# Patient Record
Sex: Female | Born: 1995 | Race: Black or African American | Hispanic: No | Marital: Single | Smoking: Never smoker
Health system: Southern US, Community
[De-identification: ages and names within clinical notes are randomized; demographics above are authoritative.]

---
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
## 2016-10-04 ENCOUNTER — Emergency Department (HOSPITAL_COMMUNITY)
Admission: EM | Admit: 2016-10-04 | Discharge: 2016-10-04 | Disposition: A | Discharge status: Home / Self Care | Payer: Medicaid Other | Attending: Emergency Medicine | Admitting: Emergency Medicine

## 2016-10-04 ENCOUNTER — Emergency Department (HOSPITAL_COMMUNITY): Payer: Medicaid Other

## 2016-10-04 ENCOUNTER — Encounter (HOSPITAL_COMMUNITY): Payer: Self-pay | Admitting: Emergency Medicine

## 2016-10-04 DIAGNOSIS — M25511 Pain in right shoulder: Secondary | ICD-10-CM | POA: Diagnosis present

## 2016-10-04 DIAGNOSIS — M7521 Bicipital tendinitis, right shoulder: Secondary | ICD-10-CM | POA: Diagnosis not present

## 2016-10-04 MED ORDER — NAPROXEN 500 MG PO TABS: 500.0000 mg | ORAL_TABLET | Freq: Two times a day (BID) | ORAL | 0 refills | Status: DC

## 2016-10-04 MED ORDER — IBUPROFEN 400 MG PO TABS
600.0000 mg | ORAL_TABLET | Freq: Once | ORAL | Status: AC
  Administered 2016-10-04: 600 mg via ORAL
  Filled 2016-10-04: qty 1

## 2016-10-04 NOTE — Discharge Instructions (Signed)
Avoid any heavy lifting with right arm. Ice your shoulder several times a day. Naprosyn for pain and inflammation. Follow up with your doctor as needed.

## 2016-10-04 NOTE — ED Notes (Signed)
See edp assessment 

## 2016-10-04 NOTE — ED Notes (Signed)
Patient transported to X-ray 

## 2016-10-04 NOTE — ED Triage Notes (Signed)
Pt with R shoulder pain that started yesterday. CMS intact. Denies injury. No pain meds today.

## 2016-10-04 NOTE — ED Provider Notes (Signed)
[facility]-EMERGENCY DEPT Provider Note   CSN: [id number] Arrival date & time: 10/04/16  1637     History   Chief Complaint Chief Complaint  Patient presents with  . Shoulder Pain    R shoulder    HPI Shelly Peters is a 21 y.o. female.  HPI Shelly Peters is a 21 y.o. female presents to emergency department complaining of right shoulder injury. Patient states that she started feeling pain this morning after waking up. She reports playing with her newborn cousin who weighs approximately 8 pounds and states that she has been thinking a lot all day yesterday. She denies any other injuries. States pain is to the anterior shoulder, sharp. She reports pain is worsened with movement. Denies any numbness distal to the injury. Denies any weakness to the right hand. Denies any prior injuries to the same joint. No other complaints.  History reviewed. No pertinent past medical history.  There are no active problems to display for this patient.   History reviewed. No pertinent surgical history.  OB History    No data available       Home Medications    Prior to Admission medications   Medication Sig Start Date End Date Taking? Authorizing Provider  diphenhydrAMINE (BENADRYL) 25 MG tablet Take 25 mg by mouth every 6 (six) hours as needed for itching or allergies.    [provider]  EPINEPHrine 0.3 mg/0.3 mL IJ SOAJ injection Inject 0.3 mLs (0.3 mg total) into the muscle once. 09/13/15   Law, Waylan BogaAlexandra M, PA-C  etonogestrel (NEXPLANON) 68 MG IMPL implant 1 each by Subdermal route once.    [provider]  naproxen (NAPROSYN) 500 MG tablet Take 1 tablet (500 mg total) by mouth 2 (two) times daily. 10/04/16   Jaynie CrumbleKirichenko, Silvino Selman, PA-C    Family History No family history on file.  Social History Social History  Substance Use Topics  . Smoking status: Never Smoker  . Smokeless tobacco: Never Used  . Alcohol use No     Allergies   Apple and Cherry   Review of  Systems Review of Systems  Constitutional: Negative for chills and fever.  Respiratory: Negative for cough, chest tightness and shortness of breath.   Cardiovascular: Negative for chest pain, palpitations and leg swelling.  Musculoskeletal: Positive for arthralgias. Negative for myalgias, neck pain and neck stiffness.  Skin: Negative for rash.  Neurological: Negative for weakness and numbness.  All other systems reviewed and are negative.    Physical Exam Updated Vital Signs BP 109/63 (BP Location: Right Arm)   Pulse 89   Temp 98.6 F (37 C) (Oral)   Resp 18   SpO2 99%   Physical Exam  Constitutional: She appears well-developed and well-nourished. No distress.  HENT:  Head: Normocephalic.  Eyes: Conjunctivae are normal.  Neck: Neck supple.  Cardiovascular: Normal rate, regular rhythm and normal heart sounds.   Pulmonary/Chest: Effort normal and breath sounds normal. No respiratory distress. She has no wheezes. She has no rales.  Musculoskeletal: She exhibits no edema.  Normal-appearing right shoulder. Tender to palpation over right anterior shoulder at the biceps insertion point. Pain with flexion of the bicep. Biceps strength is intact. No bruising or swelling. Distal radial pulses intact and equal bilaterally.  Neurological: She is alert.  Skin: Skin is warm and dry.  Psychiatric: She has a normal mood and affect. Her behavior is normal.  Nursing note and vitals reviewed.    ED Treatments / Results  Labs (all  labs ordered are listed, but only abnormal results are displayed) Labs Reviewed - No data to display  EKG  EKG Interpretation None       Radiology Dg Shoulder Right  Result Date: 10/04/2016 CLINICAL DATA:  Right shoulder pain beginning yesterday. EXAM: RIGHT SHOULDER - 2+ VIEW COMPARISON:  None. FINDINGS: There is no evidence of fracture or dislocation. There is no evidence of arthropathy or other focal bone abnormality. Soft tissues are unremarkable.  IMPRESSION: Negative. Electronically Signed   By: Charlett NoseKevin  Dover M.D.   On: 10/04/2016 19:07    Procedures Procedures (including critical care time)  Medications Ordered in ED Medications  ibuprofen (ADVIL,MOTRIN) tablet 600 mg (600 mg Oral Given 10/04/16 1915)     Initial Impression / Assessment and Plan / ED Course  I have reviewed the triage vital signs and the nursing notes.  Pertinent labs & imaging results that were available during my care of the patient were reviewed by me and considered in my medical decision making (see chart for details).     Patient with shoulder pain, on exam, point tenderness is at the bicep tendon insertion in the anterior shoulder. Question tendinitis or a bicep tendon strain from playing with her child yesterday. Will treat with NSAIDs. Ice, rest, follow-up with family doctor as needed. Prescription for naproxen provided.  Vitals:   10/04/16 1647  BP: 109/63  Pulse: 89  Resp: 18  Temp: 98.6 F (37 C)  TempSrc: Oral  SpO2: 99%     Final Clinical Impressions(s) / ED Diagnoses   Final diagnoses:  Biceps tendinitis of right upper extremity    New Prescriptions New Prescriptions   NAPROXEN (NAPROSYN) 500 MG TABLET    Take 1 tablet (500 mg total) by mouth 2 (two) times daily.     Jaynie CrumbleKirichenko, Jaaziah Schulke, PA-C 10/04/16 1949    Arby BarrettePfeiffer, Marcy, MD 10/05/16 2342

## 2016-10-09 ENCOUNTER — Encounter (HOSPITAL_COMMUNITY): Payer: Self-pay | Admitting: Emergency Medicine

## 2016-10-09 ENCOUNTER — Emergency Department (HOSPITAL_COMMUNITY)
Admission: EM | Admit: 2016-10-09 | Discharge: 2016-10-09 | Disposition: A | Discharge status: Home / Self Care | Payer: Medicaid Other | Attending: Emergency Medicine | Admitting: Emergency Medicine

## 2016-10-09 DIAGNOSIS — X58XXXA Exposure to other specified factors, initial encounter: Secondary | ICD-10-CM | POA: Diagnosis not present

## 2016-10-09 DIAGNOSIS — Y9389 Activity, other specified: Secondary | ICD-10-CM | POA: Insufficient documentation

## 2016-10-09 DIAGNOSIS — S46911A Strain of unspecified muscle, fascia and tendon at shoulder and upper arm level, right arm, initial encounter: Secondary | ICD-10-CM | POA: Insufficient documentation

## 2016-10-09 DIAGNOSIS — S4991XA Unspecified injury of right shoulder and upper arm, initial encounter: Secondary | ICD-10-CM | POA: Diagnosis present

## 2016-10-09 DIAGNOSIS — Z79899 Other long term (current) drug therapy: Secondary | ICD-10-CM | POA: Insufficient documentation

## 2016-10-09 DIAGNOSIS — Y929 Unspecified place or not applicable: Secondary | ICD-10-CM | POA: Insufficient documentation

## 2016-10-09 DIAGNOSIS — Y999 Unspecified external cause status: Secondary | ICD-10-CM | POA: Insufficient documentation

## 2016-10-09 MED ORDER — MELOXICAM 15 MG PO TABS: 15.0000 mg | ORAL_TABLET | Freq: Every day | ORAL | 0 refills | Status: DC

## 2016-10-09 NOTE — ED Provider Notes (Signed)
[facility] Provider Note   CSN: [id number] Arrival date & time: 10/09/16  2102     History   Chief Complaint Chief Complaint  Patient presents with  . Shoulder Pain    HPI Shelly Peters is a 21 y.o. female.  The history is provided by the patient. No language interpreter was used.  Shoulder Pain      Shelly Peters is a 21 y.o. female who presents to the Emergency Department complaining of right shoulder pain.  A week ago she developed pain in her right anterior shoulder. She denies any injuries to this arm. She notes pain with abduction of the shoulder. She was seen in the emergency department 5 days ago and was started on naproxen and only reports mild improvement in her symptoms with this medication. She denies any fevers, chest pain, shortness breath, abdominal pain, nausea, vomiting, no numbness, weakness.  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 She has been having difficulty with typing due to right shoulder pain.  History reviewed. No pertinent past medical history.  There are no active problems to display for this patient.   History reviewed. No pertinent surgical history.  OB History    No data available       Home Medications    Prior to Admission medications   Medication Sig Start Date End Date Taking? Authorizing Provider  diphenhydrAMINE (BENADRYL) 25 MG tablet Take 25 mg by mouth every 6 (six) hours as needed for itching or allergies.    [provider]  EPINEPHrine 0.3 mg/0.3 mL IJ SOAJ injection Inject 0.3 mLs (0.3 mg total) into the muscle once. 09/13/15   Law, Waylan BogaAlexandra M, PA-C  etonogestrel (NEXPLANON) 68 MG IMPL implant 1 each by Subdermal route once.    [provider]  naproxen (NAPROSYN) 500 MG tablet Take 1 tablet (500 mg total) by mouth 2 (two) times daily. 10/04/16   Jaynie CrumbleKirichenko, Tatyana, PA-C    Family History No family history on file.  Social History Social History    Substance Use Topics  . Smoking status: Never Smoker  . Smokeless tobacco: Never Used  . Alcohol use No     Allergies   Apple and Cherry   Review of Systems Review of Systems  All other systems reviewed and are negative.    Physical Exam Updated Vital Signs BP 120/65 (BP Location: Left Arm)   Pulse 84   Temp 98.5 F (36.9 C) (Oral)   Resp 16   Ht 5\' 6"  (1.676 m)   Wt 81.6 kg (180 lb)   SpO2 100%   BMI 29.05 kg/m   Physical Exam  Constitutional: She is oriented to person, place, and time. She appears well-developed and well-nourished.  HENT:  Head: Normocephalic and atraumatic.  Cardiovascular: Normal rate and regular rhythm.   No murmur heard. Pulmonary/Chest: Effort normal and breath sounds normal. No respiratory distress.  Abdominal: Soft. There is no tenderness. There is no rebound and no guarding.  Musculoskeletal:  2+ radial pulses bilaterally. There is point tenderness to palpation over the right AC joint. There is pain with abduction of the shoulder and lateral rotation of the right upper extremity. 5 out of 5 grip strength in bilateral upper extremities.  Neurological: She is alert and oriented to person, place, and time.  Skin: Skin is warm and dry.  Psychiatric: She has a normal mood and affect. Her behavior is normal.  Nursing note and vitals reviewed.  ED Treatments / Results  Labs (all labs ordered are listed, but only abnormal results are displayed) Labs Reviewed - No data to display  EKG  EKG Interpretation None       Radiology No results found.  Procedures Procedures (including critical care time)  Medications Ordered in ED Medications - No data to display   Initial Impression / Assessment and Plan / ED Course  I have reviewed the triage vital signs and the nursing notes.  Pertinent labs & imaging results that were available during my care of the patient were reviewed by me and considered in my medical decision making (see  chart for details).     Patient here for evaluation of right shoulder pain for the last week. Examination is consistent with musculoskeletal pain. There is no evidence of acute infection, dislocation, nerve impingement. Presentation is not consistent with referred cardiac, pulmonary or abdominal pain. Discussed with patient home care for shoulder pain with range of motion exercises. Will change naproxen.  Discussed ice, adding tylenol if needed.    Counseled patient on home care, outpatient follow up and return precautions.  Providing sling for comfort with activities but discussed importance of range of motion exercises.  Final Clinical Impressions(s) / ED Diagnoses   Final diagnoses:  None    New Prescriptions New Prescriptions   No medications on file     Tilden Fossa, MD 10/09/16 2151

## 2016-10-09 NOTE — ED Triage Notes (Signed)
Pt seen last week for R shoulder pain, dx with bicep tendinitis. Pt reports pain has worsened. Has not been able to follow up.

## 2016-10-09 NOTE — ED Notes (Signed)
Patient given discharge instructions and verbalized understanding.  Patient stable to discharge at this time.  Patient is alert and oriented to baseline.  No distressed noted at this time.  All belongings taken with the patient at discharge.   

## 2016-10-12 ENCOUNTER — Encounter (HOSPITAL_COMMUNITY): Payer: Self-pay | Admitting: Emergency Medicine

## 2016-10-12 ENCOUNTER — Emergency Department (HOSPITAL_COMMUNITY)
Admission: EM | Admit: 2016-10-12 | Discharge: 2016-10-12 | Disposition: A | Discharge status: Home / Self Care | Payer: Medicaid Other | Attending: Emergency Medicine | Admitting: Emergency Medicine

## 2016-10-12 DIAGNOSIS — M25562 Pain in left knee: Secondary | ICD-10-CM | POA: Diagnosis present

## 2016-10-12 DIAGNOSIS — M25552 Pain in left hip: Secondary | ICD-10-CM | POA: Insufficient documentation

## 2016-10-12 DIAGNOSIS — R1032 Left lower quadrant pain: Secondary | ICD-10-CM | POA: Diagnosis not present

## 2016-10-12 NOTE — ED Triage Notes (Addendum)
Pt states she woke up this morning with atraumatic  R hip and R knee pain. States it hurts to bend the knee. Alert and oriented.

## 2016-10-12 NOTE — ED Provider Notes (Signed)
[facility]-EMERGENCY DEPT Provider Note   CSN: [id number] Arrival date & time: 10/12/16  0857     History   Chief Complaint Chief Complaint  Patient presents with  . Leg Pain    HPI Shelly Peters is a 21 y.o. female with no significant past history he presents to the ED today complaining of left groin and left anterior knee pain. He states that she woke this morning and began feeling this pain out of nowhere. Pain is worse with walking, with applying pressure to her knee and with abduction of her left hip. She denies any pelvic pain, dysuria, vaginal discharge, calf pain, swelling. She is on birth control, the Nexplanon. She denies any trauma or injury. She reports that she's been having various joint aches and pains of the last 2 weeks. She states that yesterday her right ankle was hurting, last week her right shoulder was hurting, Both of which have since resolved. She has tried taking naproxen for symptoms of relief. She was recently prescribed Robaxin which she has yet to fill this prescription. No recent heavy lifting.   HPI  History reviewed. No pertinent past medical history.  There are no active problems to display for this patient.   History reviewed. No pertinent surgical history.  OB History    No data available       Home Medications    Prior to Admission medications   Medication Sig Start Date End Date Taking? Authorizing Provider  diphenhydrAMINE (BENADRYL) 25 MG tablet Take 25 mg by mouth every 6 (six) hours as needed for itching or allergies.    [provider]  EPINEPHrine 0.3 mg/0.3 mL IJ SOAJ injection Inject 0.3 mLs (0.3 mg total) into the muscle once. 09/13/15   Law, Waylan BogaAlexandra M, PA-C  etonogestrel (NEXPLANON) 68 MG IMPL implant 1 each by Subdermal route once.    [provider]  meloxicam (MOBIC) 15 MG tablet Take 1 tablet (15 mg total) by mouth daily. 10/09/16   Tilden Fossaees, Elizabeth, MD  naproxen (NAPROSYN) 500 MG tablet Take 1 tablet (500 mg total)  by mouth 2 (two) times daily. 10/04/16   Jaynie CrumbleKirichenko, Tatyana, PA-C    Family History History reviewed. No pertinent family history.  Social History Social History  Substance Use Topics  . Smoking status: Never Smoker  . Smokeless tobacco: Never Used  . Alcohol use No     Allergies   Apple and Cherry   Review of Systems Review of Systems  All other systems reviewed and are negative.    Physical Exam Updated Vital Signs BP 136/68   Pulse 78   Temp 98.4 F (36.9 C)   Resp 16   LMP 10/11/2016 (Approximate)   SpO2 100%   Physical Exam  Constitutional: She is oriented to person, place, and time. She appears well-developed and well-nourished. No distress.  HENT:  Head: Normocephalic and atraumatic.  Eyes: Conjunctivae are normal. Right eye exhibits no discharge. Left eye exhibits no discharge. No scleral icterus.  Cardiovascular: Normal rate.   Pulmonary/Chest: Effort normal.  Musculoskeletal:  TTP along her left inguinal canal without any obvious bulge. No palpable hernia. Pain increased with abduction of left hip. She also has TTP along her anterior knee without any obvious bony deformity. Pain is increased I in her groin with flexion and extension of her left knee.Negative anterior/poster drawer bilaterally. Negative ballottement test. No varus or valgus laxity. No crepitus.    Neurological: She is alert and oriented to person, place, and time. Coordination  normal.  No gait abnormality  Skin: Skin is warm and dry. No rash noted. She is not diaphoretic. No erythema. No pallor.  Psychiatric: She has a normal mood and affect. Her behavior is normal.  Nursing note and vitals reviewed.    ED Treatments / Results  Labs (all labs ordered are listed, but only abnormal results are displayed) Labs Reviewed - No data to display  EKG  EKG Interpretation None       Radiology No results found.  Procedures Procedures (including critical care time)  Medications  Ordered in ED Medications - No data to display   Initial Impression / Assessment and Plan / ED Course  I have reviewed the triage vital signs and the nursing notes.  Pertinent labs & imaging results that were available during my care of the patient were reviewed by me and considered in my medical decision making (see chart for details).    Otherwise healthy 21 year old female presents to the ED with 1 day history of left groin and left knee pain. States she woke up out of bed this morning and had this pain. Pain is worsened with pressure, palpation and abduction of left hip. My exam I do not see anything concerning for inguinal hernia. She is low risk loss criteria. Do not suspect DVT. She has no Swelling, chest pain or shortness of breath. I suspect she has a muscle strain. She was recently prescribed Robaxin which I encouraged her to take at home. Apply ice affected area. She recently obtained Medicaid and is supposed to follow-up with primary care doctor. She is ambulating without any difficulty.  Return precautions outlined in patient discharge instructions.    Final Clinical Impressions(s) / ED Diagnoses   Final diagnoses:  Acute pain of left knee  Left inguinal pain    New Prescriptions Discharge Medication List as of 10/12/2016 11:10 AM       Eriyana Sweeten, Lester Kinsman, PA-C 10/12/16 1654    Lavera Guise, MD 10/12/16 1810

## 2016-10-12 NOTE — Discharge Instructions (Signed)
Take home Naprosyn and Robaxin for muscle and joint aches. Try to exercise as much as possible. Follow up with a primary care provider for ongoing evaluation. Itching to the ED case. Severe worsening symptoms, calf pain or swelling, chest pain, shortness of breath, weakness or numbness in any extremity.

## 2016-11-10 ENCOUNTER — Encounter (HOSPITAL_COMMUNITY): Payer: Self-pay

## 2016-11-10 ENCOUNTER — Emergency Department (HOSPITAL_COMMUNITY)
Admission: EM | Admit: 2016-11-10 | Discharge: 2016-11-10 | Disposition: A | Discharge status: Home / Self Care | Payer: Self-pay | Attending: Emergency Medicine | Admitting: Emergency Medicine

## 2016-11-10 DIAGNOSIS — B9789 Other viral agents as the cause of diseases classified elsewhere: Secondary | ICD-10-CM | POA: Insufficient documentation

## 2016-11-10 DIAGNOSIS — Z79899 Other long term (current) drug therapy: Secondary | ICD-10-CM | POA: Insufficient documentation

## 2016-11-10 DIAGNOSIS — J029 Acute pharyngitis, unspecified: Secondary | ICD-10-CM | POA: Insufficient documentation

## 2016-11-10 LAB — RAPID STREP SCREEN (MED CTR MEBANE ONLY): STREPTOCOCCUS, GROUP A SCREEN (DIRECT): NEGATIVE

## 2016-11-10 NOTE — Discharge Instructions (Signed)
Your strep test is negative. Please continue ibuprofen and tylenol for pain. Return for worsening symptoms, including fever, difficulty breathing, unable to swallow saliva, neck swelling or any other symptoms concerning to you.

## 2016-11-10 NOTE — ED Provider Notes (Signed)
[facility] DEPT Provider Note   CSN: [id number] Arrival date & time: 11/10/16  3086     History   Chief Complaint Chief Complaint  Patient presents with  . Sore Throat    HPI Shelly Peters is a 21 y.o. female.  The history is provided by the patient.  Sore Throat  This is a new problem. The current episode started yesterday. The problem occurs constantly. The problem has not changed since onset.Pertinent negatives include no chest pain, no abdominal pain, no headaches and no shortness of breath. The symptoms are aggravated by swallowing. Nothing relieves the symptoms. She has tried nothing for the symptoms.   21 year old female, otherwise healthy, who presents with sore throat. Reports that her voice has been hoarse over the past 2 weeks and yesterday developed a sore throat, worse with swallowing. No fevers, cough, congestion, runny nose. He did state that she has recent exposure to somebody diagnosed with strep. No throat swelling, difficulty breathing, or muffled voice. History reviewed. No pertinent past medical history.  There are no active problems to display for this patient.   History reviewed. No pertinent surgical history.  OB History    No data available       Home Medications    Prior to Admission medications   Medication Sig Start Date End Date Taking? Authorizing Provider  etonogestrel (NEXPLANON) 68 MG IMPL implant 1 each by Subdermal route once.   Yes [provider]  ibuprofen (ADVIL,MOTRIN) 800 MG tablet Take 800 mg by mouth every 8 (eight) hours as needed for headache or moderate pain.   Yes [provider]  naproxen (NAPROSYN) 500 MG tablet Take 1 tablet (500 mg total) by mouth 2 (two) times daily. 10/04/16  Yes Kirichenko, Tatyana, PA-C  EPINEPHrine 0.3 mg/0.3 mL IJ SOAJ injection Inject 0.3 mLs (0.3 mg total) into the muscle once. Patient not taking: Reported on 11/10/2016 09/13/15   Emi Holes, PA-C  meloxicam (MOBIC) 15 MG  tablet Take 1 tablet (15 mg total) by mouth daily. Patient not taking: Reported on 11/10/2016 10/09/16   Tilden Fossa, MD    Family History History reviewed. No pertinent family history.  Social History Social History  Substance Use Topics  . Smoking status: Never Smoker  . Smokeless tobacco: Never Used  . Alcohol use No     Allergies   Apple and Cherry   Review of Systems Review of Systems  Constitutional: Negative for fever.  HENT: Positive for sore throat.   Respiratory: Negative for shortness of breath.   Cardiovascular: Negative for chest pain.  Gastrointestinal: Negative for abdominal pain.  Allergic/Immunologic: Negative for immunocompromised state.  Neurological: Negative for headaches.  Hematological: Does not bruise/bleed easily.     Physical Exam Updated Vital Signs BP 116/69 (BP Location: Left Arm)   Pulse 69   Temp 98.4 F (36.9 C) (Oral)   Resp 16   Ht  (1.676 m)   Wt 81.6 kg (180 lb)   LMP 10/11/2016 (Approximate)   SpO2 99%   BMI 29.05 kg/m   Physical Exam Physical Exam  Nursing note and vitals reviewed. Constitutional: Well developed, well nourished, non-toxic, and in no acute distress Head: Normocephalic and atraumatic.  Mouth/Throat: Oropharynx is clear and moist. No swelling or exudates Neck: Normal range of motion. Neck supple. No cervical lymphadenopathy.  Cardiovascular: Normal rate and regular rhythm.   Pulmonary/Chest: Effort normal and breath sounds normal.  Abdominal: Soft. There is no tenderness. There is no rebound and  no guarding.  Musculoskeletal: Normal range of motion.  Neurological: Alert, no facial droop, fluent speech, moves all extremities symmetrically Skin: Skin is warm and dry.  Psychiatric: Cooperative   ED Treatments / Results  Labs (all labs ordered are listed, but only abnormal results are displayed) Labs Reviewed  RAPID STREP SCREEN (NOT AT [facility])  CULTURE, GROUP A STREP [facility]    EKG  EKG  Interpretation None       Radiology No results found.  Procedures Procedures (including critical care time)  Medications Ordered in ED Medications - No data to display   Initial Impression / Assessment and Plan / ED Course  I have reviewed the triage vital signs and the nursing notes.  Pertinent labs & imaging results that were available during my care of the patient were reviewed by me and considered in my medical decision making (see chart for details).     Well appearing, in no acute distress. Normal vital signs. Breathing comfortably.Normal range of motion of the neck, no swelling, no trismus, no uvular deviation. No concerns for deep space soft tissue neck infection. Strep negative. Likely viral etiology. Discussed continued supportive care management for this.  Final Clinical Impressions(s) / ED Diagnoses   Final diagnoses:  Viral pharyngitis  Sore throat    New Prescriptions New Prescriptions   No medications on file     Lavera GuiseLiu, Meeya Goldin Duo, MD 11/10/16 1104

## 2016-11-10 NOTE — ED Triage Notes (Signed)
Patient c/o sore throat x 2 weeks and has gotten  Progressively worse.

## 2016-11-12 LAB — CULTURE, GROUP A STREP (THRC)

## 2017-05-21 DIAGNOSIS — R21 Rash and other nonspecific skin eruption: Secondary | ICD-10-CM | POA: Insufficient documentation

## 2017-05-21 DIAGNOSIS — Z5321 Procedure and treatment not carried out due to patient leaving prior to being seen by health care provider: Secondary | ICD-10-CM | POA: Insufficient documentation

## 2017-05-22 ENCOUNTER — Encounter (HOSPITAL_COMMUNITY): Payer: Self-pay | Admitting: Emergency Medicine

## 2017-05-22 ENCOUNTER — Emergency Department (HOSPITAL_COMMUNITY)
Admission: EM | Admit: 2017-05-22 | Discharge: 2017-05-22 | Discharge status: Against Medical Advice | Payer: Self-pay | Attending: Emergency Medicine | Admitting: Emergency Medicine

## 2017-05-22 MED ORDER — DIPHENHYDRAMINE HCL 50 MG/ML IJ SOLN
25.0000 mg | Freq: Once | INTRAMUSCULAR | Status: DC
  Filled 2017-05-22: qty 1

## 2017-05-22 MED ORDER — FAMOTIDINE 20 MG PO TABS
40.0000 mg | ORAL_TABLET | Freq: Once | ORAL | Status: DC
  Filled 2017-05-22: qty 2

## 2017-05-22 MED ORDER — PREDNISONE 20 MG PO TABS
60.0000 mg | ORAL_TABLET | Freq: Once | ORAL | Status: DC
  Filled 2017-05-22: qty 3

## 2017-05-22 NOTE — ED Triage Notes (Signed)
Patient here from home with complaints of allergic reaction. Rash to face, itching. Denies throat swelling. Denies taking benadryl.

## 2017-05-22 NOTE — ED Notes (Signed)
Per Gabriel Earingaquita, triage nurse, nurse witnessed patient leaving building. EDPA made aware.

## 2017-05-22 NOTE — ED Notes (Signed)
Upon arrival to room, patient is not in the room. Bathrooms also checked. EDPA made aware and will wait to see if patient can be found.

## 2017-09-17 ENCOUNTER — Encounter (HOSPITAL_COMMUNITY): Payer: Self-pay | Admitting: Emergency Medicine

## 2017-09-17 ENCOUNTER — Other Ambulatory Visit: Payer: Self-pay

## 2017-09-17 ENCOUNTER — Ambulatory Visit (HOSPITAL_COMMUNITY)
Admission: EM | Admit: 2017-09-17 | Discharge: 2017-09-17 | Disposition: A | Discharge status: Home / Self Care | Payer: Self-pay | Attending: Family Medicine | Admitting: Family Medicine

## 2017-09-17 DIAGNOSIS — J029 Acute pharyngitis, unspecified: Secondary | ICD-10-CM | POA: Insufficient documentation

## 2017-09-17 DIAGNOSIS — Z79899 Other long term (current) drug therapy: Secondary | ICD-10-CM | POA: Insufficient documentation

## 2017-09-17 LAB — POCT RAPID STREP A: STREPTOCOCCUS, GROUP A SCREEN (DIRECT): NEGATIVE

## 2017-09-17 MED ORDER — AMOXICILLIN 500 MG PO CAPS: 500.0000 mg | ORAL_CAPSULE | Freq: Two times a day (BID) | ORAL | 0 refills | Status: AC

## 2017-09-17 NOTE — ED Provider Notes (Signed)
[facility]    CSN: [id number] Arrival date & time: 09/17/17  1436     History   Chief Complaint Chief Complaint  Patient presents with  . Sore Throat    HPI Shelly Peters is a 22 y.o. female.   Shelly Peters presents with complaints of sore throat and headache for the past three days. Some chills, no specific known fever. Nasal congestion. Occasional cough. No gi/gu complaints. No rash. No known ill contacts. Denies  Any previous similar. No ear pain. Has taken benadryl and alkaselzer which have not helped with symptoms. Without contributing medical history.     ROS per HPI.      History reviewed. No pertinent past medical history.  There are no active problems to display for this patient.   History reviewed. No pertinent surgical history.  OB History   None      Home Medications    Prior to Admission medications   Medication Sig Start Date End Date Taking? Authorizing Provider  etonogestrel (NEXPLANON) 68 MG IMPL implant 1 each by Subdermal route once.   Yes [provider]  amoxicillin (AMOXIL) 500 MG capsule Take 1 capsule (500 mg total) by mouth 2 (two) times daily for 10 days. 09/21/17 10/01/17  Linus Mako B, NP  EPINEPHrine 0.3 mg/0.3 mL IJ SOAJ injection Inject 0.3 mLs (0.3 mg total) into the muscle once. Patient not taking: Reported on 11/10/2016 09/13/15   Emi Holes, PA-C  ibuprofen (ADVIL,MOTRIN) 800 MG tablet Take 800 mg by mouth every 8 (eight) hours as needed for headache or moderate pain.    [provider]  meloxicam (MOBIC) 15 MG tablet Take 1 tablet (15 mg total) by mouth daily. Patient not taking: Reported on 11/10/2016 10/09/16   Tilden Fossa, MD  naproxen (NAPROSYN) 500 MG tablet Take 1 tablet (500 mg total) by mouth 2 (two) times daily. 10/04/16   Jaynie Crumble, PA-C    Family History History reviewed. No pertinent family history.  Social History Social History   Tobacco Use  . Smoking status: Never  Smoker  . Smokeless tobacco: Never Used  Substance Use Topics  . Alcohol use: No  . Drug use: No     Allergies   Apple and Cherry   Review of Systems Review of Systems   Physical Exam Triage Vital Signs ED Triage Vitals  Enc Vitals Group     BP 09/17/17 1526 108/67     Pulse Rate 09/17/17 1526 98     Resp --      Temp 09/17/17 1526 98.7 F (37.1 C)     Temp Source 09/17/17 1526 Oral     SpO2 09/17/17 1526 100 %     Weight --      Height --      Head Circumference --      Peak Flow --      Pain Score 09/17/17 1525 8     Pain Loc --      Pain Edu? --      Excl. in [place]? --    No data found.  Updated Vital Signs BP 108/67 (BP Location: Right Arm)   Pulse 98   Temp 98.7 F (37.1 C) (Oral)   SpO2 100%    Physical Exam  Constitutional: She is oriented to person, place, and time. She appears well-developed and well-nourished. No distress.  HENT:  Head: Normocephalic and atraumatic.  Right Ear: Tympanic membrane, external ear and ear canal normal.  Left Ear:  Tympanic membrane, external ear and ear canal normal.  Nose: Nose normal.  Mouth/Throat: Uvula is midline and mucous membranes are normal. Posterior oropharyngeal erythema (petechiae noted ) present. Tonsils are 1+ on the right. Tonsils are 1+ on the left. Tonsillar exudate.  Eyes: Pupils are equal, round, and reactive to light. Conjunctivae and EOM are normal.  Cardiovascular: Normal rate, regular rhythm and normal heart sounds.  Pulmonary/Chest: Effort normal and breath sounds normal.  Lymphadenopathy:    She has no cervical adenopathy.  Neurological: She is alert and oriented to person, place, and time.  Skin: Skin is warm and dry.     UC Treatments / Results  Labs (all labs ordered are listed, but only abnormal results are displayed) Labs Reviewed  CULTURE, GROUP A STREP [facility]  POCT RAPID STREP A    EKG None  Radiology No results found.  Procedures Procedures (including critical care  time)  Medications Ordered in UC Medications - No data to display  Initial Impression / Assessment and Plan / UC Course  I have reviewed the triage vital signs and the nursing notes.  Pertinent labs & imaging results that were available during my care of the patient were reviewed by me and considered in my medical decision making (see chart for details).    Afebrile. Negative rapid strep. History and physical consistent with viral illness.  Culture pending. Amoxicillin provided if no improvement or if worsening in another 5 days. Supportive cares recommended. If symptoms worsen or do not improve in the next week to return to be seen or to follow up with PCP.  Patient verbalized understanding and agreeable to plan.    Final Clinical Impressions(s) / UC Diagnoses   Final diagnoses:  Pharyngitis, unspecified etiology     Discharge Instructions     Throat lozenges, gargles, chloraseptic spray, warm teas, popsicles etc to help with throat pain.  Tylenol and/or ibuprofen as needed for pain or fevers.   Will notify you if your culture returns positive.  If no improvement or if worsening of symptoms by Friday may pick up and take course of antibiotics. If improving no need to take.  If symptoms worsen or do not improve in the next week to return to be seen or to follow up with your PCP.      ED Prescriptions    Medication Sig Dispense Auth. Provider   amoxicillin (AMOXIL) 500 MG capsule Take 1 capsule (500 mg total) by mouth 2 (two) times daily for 10 days. 20 capsule Georgetta HaberBurky, Natalie B, NP     Controlled Substance Prescriptions [place] Controlled Substance Registry consulted? Not Applicable   Georgetta HaberBurky, Natalie B, NP 09/17/17 1652

## 2017-09-17 NOTE — ED Triage Notes (Signed)
Pt reports nasal congestion and sore throat x3 days.  Pt also reports chills.

## 2017-09-17 NOTE — Discharge Instructions (Addendum)
Throat lozenges, gargles, chloraseptic spray, warm teas, popsicles etc to help with throat pain.  Tylenol and/or ibuprofen as needed for pain or fevers.   Will notify you if your culture returns positive.  If no improvement or if worsening of symptoms by Friday may pick up and take course of antibiotics. If improving no need to take.  If symptoms worsen or do not improve in the next week to return to be seen or to follow up with your PCP.

## 2017-09-19 LAB — CULTURE, GROUP A STREP (THRC)

## 2017-09-20 ENCOUNTER — Telehealth (HOSPITAL_COMMUNITY): Payer: Self-pay

## 2017-09-20 NOTE — Telephone Encounter (Signed)
Strep test is positive. [facility] Attempted to reach patient. She is not available at this time.

## 2017-12-08 ENCOUNTER — Encounter (HOSPITAL_COMMUNITY): Payer: Self-pay | Admitting: Emergency Medicine

## 2017-12-08 ENCOUNTER — Ambulatory Visit (HOSPITAL_COMMUNITY)
Admission: EM | Admit: 2017-12-08 | Discharge: 2017-12-08 | Disposition: A | Discharge status: Home / Self Care | Payer: Self-pay | Attending: Emergency Medicine | Admitting: Emergency Medicine

## 2017-12-08 DIAGNOSIS — T7840XA Allergy, unspecified, initial encounter: Secondary | ICD-10-CM

## 2017-12-08 MED ORDER — DIPHENHYDRAMINE HCL 25 MG PO CAPS
ORAL_CAPSULE | ORAL | Status: AC
  Filled 2017-12-08: qty 2

## 2017-12-08 MED ORDER — PREDNISONE 20 MG PO TABS
ORAL_TABLET | ORAL | Status: AC
  Filled 2017-12-08: qty 2

## 2017-12-08 MED ORDER — PREDNISONE 20 MG PO TABS
40.0000 mg | ORAL_TABLET | Freq: Once | ORAL | Status: AC
  Administered 2017-12-08: 40 mg via ORAL

## 2017-12-08 MED ORDER — EPINEPHRINE 0.3 MG/0.3ML IJ SOAJ: 0.3000 mg | Freq: Once | INTRAMUSCULAR | 3 refills | Status: AC

## 2017-12-08 MED ORDER — PREDNISONE 20 MG PO TABS: 40.0000 mg | ORAL_TABLET | Freq: Every day | ORAL | 0 refills | Status: DC

## 2017-12-08 MED ORDER — DIPHENHYDRAMINE HCL 25 MG PO CAPS
50.0000 mg | ORAL_CAPSULE | Freq: Once | ORAL | Status: AC
  Administered 2017-12-08: 50 mg via ORAL

## 2017-12-08 NOTE — ED Provider Notes (Signed)
[facility]    CSN: [id number] Arrival date & time: 12/08/17  1245     History   Chief Complaint Chief Complaint  Patient presents with  . Urticaria    HPI Shelly Peters is a 22 y.o. female.   HPI   She is presenting today with complaints of hives from "head to toe".  Patient states she is "very itchy".  Patient denies swelling of lips, tongue, mouth, throat, or face.  Denies shortness of breath, wheezing, tingling or numbness of lips tongue or face.  The patient states she does feel like her right ear is starting to swell.  Patient states she is unsure of what she might potentially be allergic to other than apples and cherries.  Patient states she has not eaten any foods that she does not normally eat.  Patient states she did spend the night at someone's home yesterday on their bedding as potential allergen.    History reviewed. No pertinent past medical history.  There are no active problems to display for this patient.   History reviewed. No pertinent surgical history.  OB History   None      Home Medications    Prior to Admission medications   Medication Sig Start Date End Date Taking? Authorizing Provider  EPINEPHrine 0.3 mg/0.3 mL IJ SOAJ injection Inject 0.3 mLs (0.3 mg total) into the muscle once for 1 dose. 12/08/17 12/08/17  Servando Salina, NP  etonogestrel (NEXPLANON) 68 MG IMPL implant 1 each by Subdermal route once.    [provider]  ibuprofen (ADVIL,MOTRIN) 800 MG tablet Take 800 mg by mouth every 8 (eight) hours as needed for headache or moderate pain.    [provider]  meloxicam (MOBIC) 15 MG tablet Take 1 tablet (15 mg total) by mouth daily. Patient not taking: Reported on 11/10/2016 10/09/16   Tilden Fossa, MD  naproxen (NAPROSYN) 500 MG tablet Take 1 tablet (500 mg total) by mouth 2 (two) times daily. 10/04/16   Kirichenko, Lemont Fillers, PA-C  predniSONE (DELTASONE) 20 MG tablet Take 2 tablets (40 mg total) by mouth daily.  Starting on 12/09/2017 take two tablets (40mg ) daily until gone. 12/08/17   Servando Salina, NP    Family History History reviewed. No pertinent family history.  Social History Social History   Tobacco Use  . Smoking status: Never Smoker  . Smokeless tobacco: Never Used  Substance Use Topics  . Alcohol use: No  . Drug use: No     Allergies   Apple and Cherry   Review of Systems Review of Systems  Constitutional: Negative.  Negative for chills and fever.  HENT: Negative.  Negative for drooling, ear pain, facial swelling, sinus pressure, sneezing, sore throat, trouble swallowing and voice change.   Eyes: Negative.  Negative for itching.  Respiratory: Negative for cough, chest tightness, shortness of breath and wheezing.   Cardiovascular: Negative.   Gastrointestinal: Negative.   Endocrine: Negative.   Genitourinary: Negative.   Musculoskeletal: Negative.  Negative for neck pain and neck stiffness.  Skin: Positive for rash. Negative for pallor.  Allergic/Immunologic: Positive for environmental allergies and food allergies. Negative for immunocompromised state.  Neurological: Negative.  Negative for speech difficulty and weakness.  Hematological: Negative.   Psychiatric/Behavioral: Negative.      Physical Exam Triage Vital Signs ED Triage Vitals  Enc Vitals Group     BP      Pulse      Resp      Temp  Temp src      SpO2      Weight      Height      Head Circumference      Peak Flow      Pain Score      Pain Loc      Pain Edu?      Excl. in GC?    No data found.  Updated Vital Signs BP 122/61 (BP Location: Left Arm)   Pulse 71   Temp 97.8 F (36.6 C) (Oral)   Resp 18   SpO2 97%   Visual Acuity Right Eye Distance:   Left Eye Distance:   Bilateral Distance:    Right Eye Near:   Left Eye Near:    Bilateral Near:     Physical Exam  Constitutional: She is oriented to person, place, and time. She appears well-developed and well-nourished. No  distress.  HENT:  Head: Normocephalic and atraumatic.  Right Ear: External ear normal.  Left Ear: External ear normal.  Nose: Nose normal.  Mouth/Throat: Oropharynx is clear and moist. No oropharyngeal exudate.  Swelling noted of lips or tongue.  Eyes: Pupils are equal, round, and reactive to light. EOM are normal. Right eye exhibits no discharge. Left eye exhibits no discharge. No scleral icterus.  Neck: Normal range of motion. Neck supple. No tracheal deviation present.  Cardiovascular: Normal rate, regular rhythm, normal heart sounds and intact distal pulses. Exam reveals no gallop and no friction rub.  No murmur heard. Pulmonary/Chest: Effort normal and breath sounds normal. No stridor. No respiratory distress. She has no wheezes. She has no rales. She exhibits no tenderness.  No evidence of respiratory distress.  No adventitious breath sounds noted.  Patient is able to speak in full and complete sentences.  Musculoskeletal: Normal range of motion.  Lymphadenopathy:    She has no cervical adenopathy.  Neurological: She is alert and oriented to person, place, and time.  Skin: Skin is warm and dry. Capillary refill takes less than 2 seconds. Rash noted. Rash is urticarial. She is not diaphoretic. No erythema. No pallor.  She has hives from head to toe.  They are larger and more prominent across bilateral legs face and lower arms.  Nursing note and vitals reviewed.    UC Treatments / Results  Labs (all labs ordered are listed, but only abnormal results are displayed) Labs Reviewed - No data to display  EKG None  Radiology No results found.  Procedures Procedures (including critical care time)  Medications Ordered in UC Medications  predniSONE (DELTASONE) tablet 40 mg (40 mg Oral Given 12/08/17 1338)  diphenhydrAMINE (BENADRYL) capsule 50 mg (50 mg Oral Given 12/08/17 1338)    Initial Impression / Assessment and Plan / UC Course  I have reviewed the triage vital signs and  the nursing notes.  Pertinent labs & imaging results that were available during my care of the patient were reviewed by me and considered in my medical decision making (see chart for details).     Discussed with patient the importance carrying an EpiPen and to follow-up with an allergist to identify potential allergens.  Agree with patient that it is likely that sleeping on her friends bedding may have caused a reaction secondary to soap or detergents.  Final Clinical Impressions(s) / UC Diagnoses   Final diagnoses:  Acute allergic reaction, initial encounter     Discharge Instructions     Follow up with an allergist for further evaluation and identification of allergens.  ED Prescriptions    Medication Sig Dispense Auth. Provider   EPINEPHrine 0.3 mg/0.3 mL IJ SOAJ injection Inject 0.3 mLs (0.3 mg total) into the muscle once for 1 dose. 1 Device Weber Cooks H, NP   predniSONE (DELTASONE) 20 MG tablet Take 2 tablets (40 mg total) by mouth daily. Starting on 12/09/2017 take two tablets (40mg ) daily until gone. 15 tablet Servando Salina, NP     Controlled Substance Prescriptions [place] Controlled Substance Registry consulted? No   The usual and customary discharge instructions and warnings were given.  The patient verbalizes understanding and agrees to plan of care.      Servando Salina, NP 12/08/17 1347

## 2017-12-08 NOTE — Discharge Instructions (Signed)
Follow up with an allergist for further evaluation and identification of allergens.

## 2017-12-08 NOTE — ED Triage Notes (Signed)
Pt here for hives with  Itching starting this morning

## 2018-11-25 ENCOUNTER — Encounter (HOSPITAL_COMMUNITY): Payer: Self-pay | Admitting: Emergency Medicine

## 2018-11-25 ENCOUNTER — Ambulatory Visit (HOSPITAL_COMMUNITY)
Admission: EM | Admit: 2018-11-25 | Discharge: 2018-11-25 | Disposition: A | Discharge status: Home / Self Care | Payer: Managed Care, Other (non HMO) | Attending: Family Medicine | Admitting: Family Medicine

## 2018-11-25 ENCOUNTER — Other Ambulatory Visit: Payer: Self-pay

## 2018-11-25 DIAGNOSIS — R0789 Other chest pain: Secondary | ICD-10-CM | POA: Diagnosis not present

## 2018-11-25 MED ORDER — DICLOFENAC SODIUM 75 MG PO TBEC: 75.0000 mg | DELAYED_RELEASE_TABLET | Freq: Two times a day (BID) | ORAL | 0 refills | Status: DC

## 2018-11-25 NOTE — ED Triage Notes (Signed)
Pt here for bilateral rib pain after helping mother move this weekend

## 2018-11-25 NOTE — ED Provider Notes (Signed)
[facility]    CSN: [id number] Arrival date & time: 11/25/18  1206      History   Chief Complaint Chief Complaint  Patient presents with  . rib pain    HPI Shelly Peters is a 23 y.o. female.   This is an established 23 year old most: Urgent care patient.  She presents with bilateral rib pain after helping move her mother yesterday.  She was moving boxes and furniture.  The pain that she is experiencing is anterior and primarily in the left lower anterior rib region.  There is a little bit of swelling there as well.  She is having no difficulty with breathing.   Patient works for [employer] and does not feel she can lift.  She took Aleve last night and it helped a little bit.     History reviewed. No pertinent past medical history.  There are no active problems to display for this patient.   History reviewed. No pertinent surgical history.  OB History   No obstetric history on file.      Home Medications    Prior to Admission medications   Medication Sig Start Date End Date Taking? Authorizing Provider  diclofenac (VOLTAREN) 75 MG EC tablet Take 1 tablet (75 mg total) by mouth 2 (two) times daily. 11/25/18   Elvina SidleLauenstein, Torben Soloway, MD  etonogestrel (NEXPLANON) 68 MG IMPL implant 1 each by Subdermal route once.    [provider]    Family History Family History  Problem Relation Age of Onset  . Healthy Mother   . Healthy Father     Social History Social History   Tobacco Use  . Smoking status: Never Smoker  . Smokeless tobacco: Never Used  Substance Use Topics  . Alcohol use: No  . Drug use: No     Allergies   Apple and Cherry   Review of Systems Review of Systems  Constitutional: Negative.   Respiratory: Positive for chest tightness. Negative for shortness of breath.   Gastrointestinal: Negative.   All other systems reviewed and are negative.    Physical Exam Triage Vital Signs ED Triage Vitals [11/25/18 1223]  Enc Vitals  Group     BP (!) 113/57     Pulse Rate 66     Resp 18     Temp 98.5 F (36.9 C)     Temp Source Oral     SpO2 99 %     Weight      Height      Head Circumference      Peak Flow      Pain Score 7     Pain Loc      Pain Edu?      Excl. in GC?    No data found.  Updated Vital Signs BP (!) 113/57 (BP Location: Right Arm)   Pulse 66   Temp 98.5 F (36.9 C) (Oral)   Resp 18   SpO2 99%    Physical Exam Vitals signs and nursing note reviewed.  Constitutional:      General: She is not in acute distress.    Appearance: Normal appearance. She is normal weight. She is not ill-appearing.  Eyes:     Conjunctiva/sclera: Conjunctivae normal.  Neck:     Musculoskeletal: Normal range of motion and neck supple.  Cardiovascular:     Rate and Rhythm: Normal rate and regular rhythm.     Pulses: Normal pulses.     Heart sounds: Normal heart sounds.  Pulmonary:     Effort: Pulmonary effort is normal.     Breath sounds: Normal breath sounds.     Comments: Mildly tender lower ribs bilaterally, worse on the left.  There is mild swelling overlying the anterior medial lower 3 ribs. Musculoskeletal: Normal range of motion.  Skin:    General: Skin is warm and dry.  Neurological:     General: No focal deficit present.     Mental Status: She is alert.  Psychiatric:        Mood and Affect: Mood normal.        Behavior: Behavior normal.        Thought Content: Thought content normal.        Judgment: Judgment normal.      UC Treatments / Results  Labs (all labs ordered are listed, but only abnormal results are displayed) Labs Reviewed - No data to display  EKG   Radiology No results found.  Procedures Procedures (including critical care time)  Medications Ordered in UC Medications - No data to display  Initial Impression / Assessment and Plan / UC Course  I have reviewed the triage vital signs and the nursing notes.  Pertinent labs & imaging results that were available  during my care of the patient were reviewed by me and considered in my medical decision making (see chart for details).    Final Clinical Impressions(s) / UC Diagnoses   Final diagnoses:  Chest wall pain   Discharge Instructions   None    ED Prescriptions    Medication Sig Dispense Auth. Provider   diclofenac (VOLTAREN) 75 MG EC tablet Take 1 tablet (75 mg total) by mouth 2 (two) times daily. 14 tablet Robyn Haber, MD     I have reviewed the PDMP during this encounter.   Robyn Haber, MD 11/25/18 1245

## 2019-03-19 ENCOUNTER — Other Ambulatory Visit: Payer: Managed Care, Other (non HMO)

## 2019-03-21 ENCOUNTER — Other Ambulatory Visit: Payer: Managed Care, Other (non HMO)

## 2019-08-02 ENCOUNTER — Encounter (HOSPITAL_COMMUNITY): Payer: Self-pay | Admitting: *Deleted

## 2019-08-02 ENCOUNTER — Ambulatory Visit (HOSPITAL_COMMUNITY)
Admission: EM | Admit: 2019-08-02 | Discharge: 2019-08-02 | Disposition: A | Discharge status: Home / Self Care | Payer: Managed Care, Other (non HMO) | Attending: Internal Medicine | Admitting: Internal Medicine

## 2019-08-02 ENCOUNTER — Other Ambulatory Visit: Payer: Self-pay

## 2019-08-02 DIAGNOSIS — Z3202 Encounter for pregnancy test, result negative: Secondary | ICD-10-CM

## 2019-08-02 DIAGNOSIS — Z113 Encounter for screening for infections with a predominantly sexual mode of transmission: Secondary | ICD-10-CM | POA: Insufficient documentation

## 2019-08-02 LAB — POCT URINALYSIS DIP (DEVICE)
Bilirubin Urine: NEGATIVE
Glucose, UA: NEGATIVE mg/dL
Hgb urine dipstick: NEGATIVE
Ketones, ur: NEGATIVE mg/dL
Nitrite: NEGATIVE
Protein, ur: NEGATIVE mg/dL
Specific Gravity, Urine: 1.025 (ref 1.005–1.030)
Urobilinogen, UA: 1 mg/dL (ref 0.0–1.0)
pH: 7 (ref 5.0–8.0)

## 2019-08-02 LAB — POC URINE PREG, ED: Preg Test, Ur: NEGATIVE

## 2019-08-02 LAB — HIV ANTIBODY (ROUTINE TESTING W REFLEX): HIV Screen 4th Generation wRfx: NONREACTIVE

## 2019-08-02 NOTE — ED Provider Notes (Signed)
[facility]    CSN: [id number] Arrival date & time: 08/02/19  1249      History   Chief Complaint Chief Complaint  Patient presents with  . STD testing    HPI Shelly Peters is a 24 y.o. female no significant past medical history presenting today for STD screening.  Patient would like to be screened for STDs as she reports that she has recently been having unprotected intercourse.  She denies any known exposures.  Denies specific symptoms prompting arrival.  She denies abdominal pain nausea vomiting or fevers.  Denies abnormal discharge or irregular bleeding.  Denies urinary symptoms of dysuria, increased frequency or urgency.  Last menstrual cycle was around 5/19.  HPI  History reviewed. No pertinent past medical history.  There are no problems to display for this patient.   History reviewed. No pertinent surgical history.  OB History   No obstetric history on file.      Home Medications    Prior to Admission medications   Medication Sig Start Date End Date Taking? Authorizing Provider  etonogestrel (NEXPLANON) 68 MG IMPL implant 1 each by Subdermal route once.   Yes [provider]    Family History Family History  Problem Relation Age of Onset  . Healthy Mother   . Healthy Father     Social History Social History   Tobacco Use  . Smoking status: Never Smoker  . Smokeless tobacco: Never Used  Substance Use Topics  . Alcohol use: Yes    Comment: occasionally  . Drug use: No     Allergies   Apple and Cherry   Review of Systems Review of Systems  Constitutional: Negative for fever.  Respiratory: Negative for shortness of breath.   Cardiovascular: Negative for chest pain.  Gastrointestinal: Negative for abdominal pain, diarrhea, nausea and vomiting.  Genitourinary: Negative for dysuria, flank pain, genital sores, hematuria, menstrual problem, vaginal bleeding, vaginal discharge and vaginal pain.  Musculoskeletal: Negative for  back pain.  Skin: Negative for rash.  Neurological: Negative for dizziness, light-headedness and headaches.     Physical Exam Triage Vital Signs ED Triage Vitals [08/02/19 1335]  Enc Vitals Group     BP (!) 122/58     Pulse Rate 74     Resp 16     Temp 98.2 F (36.8 C)     Temp Source Oral     SpO2 100 %     Weight      Height      Head Circumference      Peak Flow      Pain Score 0     Pain Loc      Pain Edu?      Excl. in [place]?    No data found.  Updated Vital Signs BP (!) 122/58   Pulse 74   Temp 98.2 F (36.8 C) (Oral)   Resp 16   LMP 07/23/2019 (Approximate)   SpO2 100%   Visual Acuity Right Eye Distance:   Left Eye Distance:   Bilateral Distance:    Right Eye Near:   Left Eye Near:    Bilateral Near:     Physical Exam Vitals and nursing note reviewed.  Constitutional:      Appearance: She is well-developed.     Comments: No acute distress  HENT:     Head: Normocephalic and atraumatic.     Nose: Nose normal.  Eyes:     Conjunctiva/sclera: Conjunctivae normal.  Cardiovascular:  Rate and Rhythm: Normal rate.  Pulmonary:     Effort: Pulmonary effort is normal. No respiratory distress.  Abdominal:     General: There is no distension.     Comments: Soft, nondistended, nontender to light and deep palpation throughout abdomen  Musculoskeletal:        General: Normal range of motion.     Cervical back: Neck supple.  Skin:    General: Skin is warm and dry.  Neurological:     Mental Status: She is alert and oriented to person, place, and time.      UC Treatments / Results  Labs (all labs ordered are listed, but only abnormal results are displayed) Labs Reviewed  POCT URINALYSIS DIP (DEVICE) - Abnormal; Notable for the following components:      Result Value   Leukocytes,Ua SMALL (*)    All other components within normal limits  HIV ANTIBODY (ROUTINE TESTING W REFLEX)  RPR  POC URINE PREG, ED  CERVICOVAGINAL ANCILLARY ONLY     EKG   Radiology No results found.  Procedures Procedures (including critical care time)  Medications Ordered in UC Medications - No data to display  Initial Impression / Assessment and Plan / UC Course  I have reviewed the triage vital signs and the nursing notes.  Pertinent labs & imaging results that were available during my care of the patient were reviewed by me and considered in my medical decision making (see chart for details).    Pregnancy test negative, small leuks, UA otherwise unremarkable. STD screening pending, vaginal swab along with HIV and RPR blood work.  Currently asymptomatic without known exposure, deferring any treatment.  Will call with results and alter treatment as needed.  Discussed strict return precautions. Patient verbalized understanding and is agreeable with plan.  Final Clinical Impressions(s) / UC Diagnoses   Final diagnoses:  Screen for STD (sexually transmitted disease)     Discharge Instructions     We are testing you for HIV, Syphillis, Gonorrhea, Chlamydia, Trichomonas, Yeast and Bacterial Vaginosis. We will call you if anything is positive and let you know if you require any further treatment. Please inform partners of any positive results.   Please return if symptoms not improving with treatment, development of fever, nausea, vomiting, abdominal pain.    ED Prescriptions    None     PDMP not reviewed this encounter.   Lew Dawes, [place] 08/02/19 1453

## 2019-08-02 NOTE — ED Triage Notes (Signed)
Pt denies any c/o's.  States she wishes to be checked for STDs and UTI.

## 2019-08-02 NOTE — Discharge Instructions (Signed)
We are testing you for HIV, Syphillis, Gonorrhea, Chlamydia, Trichomonas, Yeast and Bacterial Vaginosis. We will call you if anything is positive and let you know if you require any further treatment. Please inform partners of any positive results.   Please return if symptoms not improving with treatment, development of fever, nausea, vomiting, abdominal pain.  

## 2019-08-03 LAB — RPR: RPR Ser Ql: NONREACTIVE

## 2019-08-06 LAB — CERVICOVAGINAL ANCILLARY ONLY
Bacterial Vaginitis (gardnerella): POSITIVE — AB
Candida Glabrata: NEGATIVE
Candida Vaginitis: NEGATIVE
Chlamydia: NEGATIVE
Comment: NEGATIVE
Comment: NEGATIVE
Comment: NEGATIVE
Comment: NEGATIVE
Comment: NEGATIVE
Comment: NORMAL
Neisseria Gonorrhea: NEGATIVE
Trichomonas: NEGATIVE

## 2019-08-07 ENCOUNTER — Telehealth (HOSPITAL_COMMUNITY): Payer: Self-pay | Admitting: Orthopedic Surgery

## 2019-08-07 MED ORDER — METRONIDAZOLE 500 MG PO TABS: 500.0000 mg | ORAL_TABLET | Freq: Two times a day (BID) | ORAL | 0 refills | Status: DC

## 2019-12-06 ENCOUNTER — Encounter (HOSPITAL_COMMUNITY): Payer: Self-pay

## 2019-12-06 ENCOUNTER — Ambulatory Visit (HOSPITAL_COMMUNITY)
Admission: EM | Admit: 2019-12-06 | Discharge: 2019-12-06 | Disposition: A | Discharge status: Home / Self Care | Payer: Medicaid Other | Attending: Family Medicine | Admitting: Family Medicine

## 2019-12-06 ENCOUNTER — Other Ambulatory Visit: Payer: Self-pay

## 2019-12-06 DIAGNOSIS — M542 Cervicalgia: Secondary | ICD-10-CM

## 2019-12-06 DIAGNOSIS — S161XXA Strain of muscle, fascia and tendon at neck level, initial encounter: Secondary | ICD-10-CM

## 2019-12-06 DIAGNOSIS — M25511 Pain in right shoulder: Secondary | ICD-10-CM

## 2019-12-06 DIAGNOSIS — M546 Pain in thoracic spine: Secondary | ICD-10-CM

## 2019-12-06 MED ORDER — IBUPROFEN 800 MG PO TABS: 800.0000 mg | ORAL_TABLET | Freq: Three times a day (TID) | ORAL | 0 refills | Status: DC | PRN

## 2019-12-06 MED ORDER — CYCLOBENZAPRINE HCL 10 MG PO TABS: 10.0000 mg | ORAL_TABLET | Freq: Two times a day (BID) | ORAL | 0 refills | Status: DC | PRN
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
## 2019-12-06 NOTE — Discharge Instructions (Signed)
Take ibuprofen as needed for your pain.    Take the muscle relaxer Flexeril as needed for muscle spasm; Do not drive, operate machinery, or drink alcohol with this medication as it may make you drowsy.    Follow up with your primary care provider or an orthopedist if your pain is not improving.     
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
## 2020-03-18 ENCOUNTER — Other Ambulatory Visit: Payer: Medicaid Other

## 2020-03-18 ENCOUNTER — Other Ambulatory Visit: Payer: Self-pay

## 2020-03-18 DIAGNOSIS — Z20822 Contact with and (suspected) exposure to covid-19: Secondary | ICD-10-CM

## 2020-03-21 LAB — NOVEL CORONAVIRUS, NAA: SARS-CoV-2, NAA: DETECTED — AB

## 2020-03-24 ENCOUNTER — Other Ambulatory Visit: Payer: Medicaid Other

## 2020-09-08 ENCOUNTER — Ambulatory Visit (HOSPITAL_COMMUNITY)
Admission: EM | Admit: 2020-09-08 | Discharge: 2020-09-08 | Disposition: A | Discharge status: Home / Self Care | Payer: Self-pay | Attending: Internal Medicine | Admitting: Internal Medicine

## 2020-09-08 ENCOUNTER — Encounter (HOSPITAL_COMMUNITY): Payer: Self-pay | Admitting: *Deleted

## 2020-09-08 ENCOUNTER — Other Ambulatory Visit: Payer: Self-pay

## 2020-09-08 ENCOUNTER — Ambulatory Visit (INDEPENDENT_AMBULATORY_CARE_PROVIDER_SITE_OTHER): Payer: Self-pay

## 2020-09-08 DIAGNOSIS — M25572 Pain in left ankle and joints of left foot: Secondary | ICD-10-CM

## 2020-09-08 DIAGNOSIS — W19XXXA Unspecified fall, initial encounter: Secondary | ICD-10-CM

## 2020-09-08 MED ORDER — IBUPROFEN 800 MG PO TABS: 800.0000 mg | ORAL_TABLET | Freq: Three times a day (TID) | ORAL | 0 refills | Status: DC | PRN

## 2020-09-08 NOTE — ED Triage Notes (Signed)
Pt stepped in a hole Monday and has Lt ankle pain.

## 2020-09-08 NOTE — ED Provider Notes (Signed)
[facility]    CSN: [id number] Arrival date & time: 09/08/20  1026      History   Chief Complaint Chief Complaint  Patient presents with   Ankle Pain    HPI Shelly Peters is a 25 y.o. female.   HPI  Ankle Pain: Pt reports that on Monday she accidentally stepped in a hole with her left foot. She twisted her ankle inward when this occurred causing severe pain. She reports that since this time she has had trouble ambulating due to pain. An ankle brace and ibuprofen has helped. No numbness, tingling, skin breakdown.   History reviewed. No pertinent past medical history.  There are no problems to display for this patient.   History reviewed. No pertinent surgical history.  OB History   No obstetric history on file.      Home Medications    Prior to Admission medications   Medication Sig Start Date End Date Taking? Authorizing Provider  cyclobenzaprine (FLEXERIL) 10 MG tablet Take 1 tablet (10 mg total) by mouth 2 (two) times daily as needed for muscle spasms. 12/06/19   Moshe Cipro, NP  etonogestrel (NEXPLANON) 68 MG IMPL implant 1 each by Subdermal route once.    [provider]  ibuprofen (ADVIL) 800 MG tablet Take 1 tablet (800 mg total) by mouth every 8 (eight) hours as needed for moderate pain. 09/08/20   Rushie Chestnut, PA-C  metroNIDAZOLE (FLAGYL) 500 MG tablet Take 1 tablet (500 mg total) by mouth 2 (two) times daily. 08/07/19   Lamptey, Britta Mccreedy, MD    Family History Family History  Problem Relation Age of Onset   Healthy Mother    Healthy Father     Social History Social History   Tobacco Use   Smoking status: Never   Smokeless tobacco: Never  Vaping Use   Vaping Use: Some days  Substance Use Topics   Alcohol use: Yes    Comment: occasionally   Drug use: No     Allergies   Apple and Cherry   Review of Systems Review of Systems  As stated above in HPI Physical Exam Triage Vital Signs ED Triage Vitals [09/08/20  1149]  Enc Vitals Group     BP 104/63     Pulse Rate 84     Resp 20     Temp 98.4 F (36.9 C)     Temp src      SpO2 100 %     Weight      Height      Head Circumference      Peak Flow      Pain Score 5     Pain Loc      Pain Edu?      Excl. in GC?    No data found.  Updated Vital Signs BP 104/63   Pulse 84   Temp 98.4 F (36.9 C)   Resp 20   SpO2 100%   Physical Exam Vitals and nursing note reviewed.  Constitutional:      Appearance: Normal appearance.  Cardiovascular:     Pulses: Normal pulses.  Musculoskeletal:     Comments: ROM reduced by 50% of the left ankle due to pain. Mild edema of the lateral left ankle with some ecchymosis.   Skin:    General: Skin is warm.     Capillary Refill: Capillary refill takes less than 2 seconds.  Neurological:     General: No focal deficit present.  Mental Status: She is alert.     UC Treatments / Results  Labs (all labs ordered are listed, but only abnormal results are displayed) Labs Reviewed - No data to display  EKG   Radiology DG Ankle Complete Left  Result Date: 09/08/2020 CLINICAL DATA:  Fall.  Pain left ankle. EXAM: LEFT ANKLE COMPLETE - 3+ VIEW COMPARISON:  None. FINDINGS: There is no evidence of fracture, dislocation, or joint effusion. There is no evidence of arthropathy or other focal bone abnormality. Soft tissues are unremarkable. IMPRESSION: Negative. Electronically Signed   By: Signa Kell M.D.   On: 09/08/2020 12:34    Procedures Procedures (including critical care time)  Medications Ordered in UC Medications - No data to display  Initial Impression / Assessment and Plan / UC Course  I have reviewed the triage vital signs and the nursing notes.  Pertinent labs & imaging results that were available during my care of the patient were reviewed by me and considered in my medical decision making (see chart for details).     New.  Her x-ray is negative for fracture or other acute abnormality.   This likely represents a sprain of the ankle.  Getting her fitted with a cam boot and sending in a refill of her ibuprofen.  We discussed RICE.  We discussed red flag signs and symptoms.  Work note given. Final Clinical Impressions(s) / UC Diagnoses   Final diagnoses:  Acute left ankle pain   Discharge Instructions   None    ED Prescriptions     Medication Sig Dispense Auth. Provider   ibuprofen (ADVIL) 800 MG tablet Take 1 tablet (800 mg total) by mouth every 8 (eight) hours as needed for moderate pain. 21 tablet Rushie Chestnut, [place]      PDMP not reviewed this encounter.   Rushie Chestnut, [place] 09/08/20 1302

## 2021-03-02 ENCOUNTER — Encounter (HOSPITAL_COMMUNITY): Payer: Self-pay

## 2021-03-02 ENCOUNTER — Emergency Department (HOSPITAL_COMMUNITY)
Admission: EM | Admit: 2021-03-02 | Discharge: 2021-03-02 | Disposition: A | Discharge status: Home / Self Care | Payer: BC Managed Care – PPO | Attending: Emergency Medicine | Admitting: Emergency Medicine

## 2021-03-02 ENCOUNTER — Other Ambulatory Visit: Payer: Self-pay

## 2021-03-02 DIAGNOSIS — J101 Influenza due to other identified influenza virus with other respiratory manifestations: Secondary | ICD-10-CM

## 2021-03-02 DIAGNOSIS — Z20822 Contact with and (suspected) exposure to covid-19: Secondary | ICD-10-CM | POA: Diagnosis not present

## 2021-03-02 DIAGNOSIS — R509 Fever, unspecified: Secondary | ICD-10-CM | POA: Diagnosis present

## 2021-03-02 LAB — RESP PANEL BY RT-PCR (FLU A&B, COVID) ARPGX2
Influenza A by PCR: POSITIVE — AB
Influenza B by PCR: NEGATIVE
SARS Coronavirus 2 by RT PCR: NEGATIVE

## 2021-03-02 MED ORDER — BENZONATATE 100 MG PO CAPS: 100.0000 mg | ORAL_CAPSULE | Freq: Three times a day (TID) | ORAL | 0 refills | Status: DC

## 2021-03-02 MED ORDER — ACETAMINOPHEN 325 MG PO TABS
650.0000 mg | ORAL_TABLET | Freq: Once | ORAL | Status: AC | PRN
  Administered 2021-03-02: 19:00:00 650 mg via ORAL
  Filled 2021-03-02: qty 2

## 2021-03-02 MED ORDER — ALBUTEROL SULFATE HFA 108 (90 BASE) MCG/ACT IN AERS
1.0000 | INHALATION_SPRAY | Freq: Once | RESPIRATORY_TRACT | Status: AC
Start: 2021-03-02 — End: 2021-03-02
  Administered 2021-03-02: 21:00:00 1 via RESPIRATORY_TRACT
  Filled 2021-03-02: qty 6.7

## 2021-03-02 NOTE — ED Triage Notes (Signed)
Pt complains of sore throat, fever, cough, body aches x3 days

## 2021-03-02 NOTE — Discharge Instructions (Addendum)
Your COVID and flu swab today was positive for flu.  You will be prescribed Tessalon Perles, take as prescribed.  You may continue taking over-the-counter medications as needed for your symptoms.  You will be prescribed albuterol inhaler, take as needed for your symptoms.  You may follow-up with your primary care provider if needed.  If you do not have a primary care provider you may follow-up with [facility] department as needed.  Ensure to maintain fluid intake.  You may take over-the-counter medications as needed for your symptoms.  Return to the ED if you are experiencing increasing/worsening fever, decreased p.o. intake, worsening symptoms.

## 2021-03-02 NOTE — ED Notes (Signed)
An After Visit Summary was printed and given to the patient. Discharge instructions given and no further questions at this time.  

## 2021-03-02 NOTE — ED Provider Notes (Signed)
[facility]-EMERGENCY DEPT Provider Note   CSN: [id number] Arrival date & time: 03/02/21  1740     History Chief Complaint  Patient presents with   Fever    Shelly Peters is a 25 y.o. female with no significant past medical history who presents to the ED complaining of fever x3 days.  Patient has sick contacts at home.  Patient has associated sore throat, cough, myalgias, rhinorrhea, nasal congestion.  Has tried nyquil and otc tylenol cold and flu with relief of her symptoms.  Denies chest pain, shortness of breath, nausea, vomiting.  Denies history of asthma.  The history is provided by the patient. No language interpreter was used.      History reviewed. No pertinent past medical history.  There are no problems to display for this patient.   History reviewed. No pertinent surgical history.   OB History   No obstetric history on file.     Family History  Problem Relation Age of Onset   Healthy Mother    Healthy Father     Social History   Tobacco Use   Smoking status: Never   Smokeless tobacco: Never  Vaping Use   Vaping Use: Some days  Substance Use Topics   Alcohol use: Yes    Comment: occasionally   Drug use: No    Home Medications Prior to Admission medications   Medication Sig Start Date End Date Taking? Authorizing Provider  benzonatate (TESSALON) 100 MG capsule Take 1 capsule (100 mg total) by mouth every 8 (eight) hours. 03/02/21  Yes Shelanda Duvall A, PA-C  cyclobenzaprine (FLEXERIL) 10 MG tablet Take 1 tablet (10 mg total) by mouth 2 (two) times daily as needed for muscle spasms. 12/06/19   Moshe Cipro, NP  etonogestrel (NEXPLANON) 68 MG IMPL implant 1 each by Subdermal route once.    [provider]  ibuprofen (ADVIL) 800 MG tablet Take 1 tablet (800 mg total) by mouth every 8 (eight) hours as needed for moderate pain. 09/08/20   Rushie Chestnut, PA-C  metroNIDAZOLE (FLAGYL) 500 MG tablet Take 1 tablet (500 mg  total) by mouth 2 (two) times daily. 08/07/19   Lamptey, Britta Mccreedy, MD    Allergies    Apple and Valentino Saxon  Review of Systems   Review of Systems  Constitutional:  Positive for fever. Negative for chills.  HENT:  Positive for congestion, rhinorrhea and sore throat. Negative for trouble swallowing.   Respiratory:  Positive for cough. Negative for shortness of breath.   Cardiovascular:  Negative for chest pain.  Gastrointestinal:  Negative for abdominal pain, nausea and vomiting.  Musculoskeletal:  Positive for myalgias.  Skin:  Negative for rash.  All other systems reviewed and are negative.  Physical Exam Updated Vital Signs BP 122/74    Pulse 90    Temp 100.2 F (37.9 C) (Oral)    Resp 18    Ht 5\' 6"  (1.676 m)    Wt 90.7 kg    LMP 02/13/2021 (Approximate)    SpO2 99%    BMI 32.28 kg/m   Physical Exam Vitals and nursing note reviewed.  Constitutional:      General: She is not in acute distress.    Appearance: She is not diaphoretic.  HENT:     Head: Normocephalic and atraumatic.     Mouth/Throat:     Pharynx: No oropharyngeal exudate.  Eyes:     General: No scleral icterus.    Conjunctiva/sclera: Conjunctivae normal.  Cardiovascular:  Rate and Rhythm: Normal rate and regular rhythm.     Pulses: Normal pulses.     Heart sounds: Normal heart sounds.  Pulmonary:     Effort: Pulmonary effort is normal. No respiratory distress.     Breath sounds: Normal breath sounds. No wheezing.  Abdominal:     General: Bowel sounds are normal.     Palpations: Abdomen is soft. There is no mass.     Tenderness: There is no abdominal tenderness. There is no guarding or rebound.  Musculoskeletal:        General: Normal range of motion.     Cervical back: Normal range of motion and neck supple.  Skin:    General: Skin is warm and dry.  Neurological:     Mental Status: She is alert.  Psychiatric:        Behavior: Behavior normal.    ED Results / Procedures / Treatments   Labs (all labs  ordered are listed, but only abnormal results are displayed) Labs Reviewed  RESP PANEL BY RT-PCR (FLU A&B, COVID) ARPGX2 - Abnormal; Notable for the following components:      Result Value   Influenza A by PCR POSITIVE (*)    All other components within normal limits    EKG None  Radiology No results found.  Procedures Procedures   Medications Ordered in ED Medications  albuterol (VENTOLIN HFA) 108 (90 Base) MCG/ACT inhaler 1 puff (has no administration in time range)  acetaminophen (TYLENOL) tablet 650 mg (650 mg Oral Given 03/02/21 1840)    ED Course  I have reviewed the triage vital signs and the nursing notes.  Pertinent labs & imaging results that were available during my care of the patient were reviewed by me and considered in my medical decision making (see chart for details).    MDM Rules/Calculators/A&P                         Patient with fever, sore throat, myalgias, rhinorrhea, nasal congestion, cough x3 days.  Denies sick contacts. Given Tylenol in the emergency department for symptomatic therapy.  Vital signs stable, patient afebrile, not tachycardic or hypoxic.  Differential diagnosis includes COVID, Flu, or viral URI with cough. On exam patient without acute cardiovascular, pulmonary, abdominal exam without acute findings. Swabbed for COVID, flu.  Results showed positive for influenza A.  Low suspicion for COVID.  Influenza as likely etiology.    Patient is outside of the window for Tamiflu, discussed with patient she can continue to use over-the-counter medications for relief of her symptoms.  Will prescribe Tessalon Perles to aid with cough.  Patient provided albuterol inhaler in the ED.  Work note provided. Discussed with patient that they should not attend work until they are fever free for 24 hours.  Supportive care and strict return precautions discussed with patient.  Patient acknowledges and voices understanding. Appears safe for discharge at this time.   Follow-up as indicated in discharge paperwork.   Final Clinical Impression(s) / ED Diagnoses Final diagnoses:  Influenza A    Rx / DC Orders ED Discharge Orders          Ordered    benzonatate (TESSALON) 100 MG capsule  Every 8 hours        03/02/21 2047             Chalmer Zheng A, PA-C 03/02/21 2051    Gloris Manchester, MD 03/03/21 [phone]

## 2021-06-23 ENCOUNTER — Encounter (HOSPITAL_COMMUNITY): Payer: Self-pay | Admitting: Emergency Medicine

## 2021-06-23 ENCOUNTER — Other Ambulatory Visit: Payer: Self-pay

## 2021-06-23 ENCOUNTER — Ambulatory Visit (HOSPITAL_COMMUNITY)
Admission: EM | Admit: 2021-06-23 | Discharge: 2021-06-23 | Disposition: A | Discharge status: Home / Self Care | Payer: BC Managed Care – PPO | Attending: Family Medicine | Admitting: Family Medicine

## 2021-06-23 DIAGNOSIS — J069 Acute upper respiratory infection, unspecified: Secondary | ICD-10-CM | POA: Diagnosis not present

## 2021-06-23 DIAGNOSIS — Z20822 Contact with and (suspected) exposure to covid-19: Secondary | ICD-10-CM | POA: Diagnosis not present

## 2021-06-23 DIAGNOSIS — R051 Acute cough: Secondary | ICD-10-CM | POA: Diagnosis present

## 2021-06-23 MED ORDER — BENZONATATE 100 MG PO CAPS: 100.0000 mg | ORAL_CAPSULE | Freq: Three times a day (TID) | ORAL | 0 refills | Status: AC

## 2021-06-23 NOTE — ED Triage Notes (Signed)
Pt is present today with cough, nasal congestion, and sinus pressure. Pt sx started Tuesday  ?

## 2021-06-23 NOTE — Discharge Instructions (Addendum)
Benzonatate 100 mg--1 capsule 3 times daily as needed for cough ? ?You can take mucinex D for daytime, as should not make you sleepy. ? ?You have been swabbed for COVID, and the test will result in the next 24 hours. Our staff will call you if positive. If the test is positive, you should quarantine for 5 days.  ?

## 2021-06-23 NOTE — ED Provider Notes (Addendum)
?[facility] ? ? ? ?CSN: [id number] ?Arrival date & time: 06/23/21  1630 ? ? ?  ? ?History   ?Chief Complaint ?Chief Complaint  ?Patient presents with  ? Cough  ? Nasal Congestion  ? Facial Pain  ? ? ?HPI ?Shelly Peters is a 26 y.o. female.  ? ? ?Cough ?Here for a 2-day history of congestion and cough and a little sore throat from the drainage.  Maybe some chills, but no fever measured.  No nausea, but she did not throw up once after lunch today.  She is not nauseated now, and she feels that was due to some bad food.  No diarrhea ? ?She did do a home COVID test that was negative ? ?History reviewed. No pertinent past medical history. ? ?There are no problems to display for this patient. ? ? ?History reviewed. No pertinent surgical history. ? ?OB History   ?No obstetric history on file. ?  ? ? ? ?Home Medications   ? ?Prior to Admission medications   ?Medication Sig Start Date End Date Taking? Authorizing Provider  ?benzonatate (TESSALON) 100 MG capsule Take 1 capsule (100 mg total) by mouth every 8 (eight) hours. 06/23/21   Zenia Resides, MD  ?etonogestrel (NEXPLANON) 68 MG IMPL implant 1 each by Subdermal route once.    [provider]  ? ? ?Family History ?Family History  ?Problem Relation Age of Onset  ? Healthy Mother   ? Healthy Father   ? ? ?Social History ?Social History  ? ?Tobacco Use  ? Smoking status: Never  ? Smokeless tobacco: Never  ?Vaping Use  ? Vaping Use: Some days  ?Substance Use Topics  ? Alcohol use: Yes  ?  Comment: occasionally  ? Drug use: No  ? ? ? ?Allergies   ?Apple juice and Cherry ? ? ?Review of Systems ?Review of Systems  ?Respiratory:  Positive for cough.   ? ? ?Physical Exam ?Triage Vital Signs ?ED Triage Vitals [06/23/21 1648]  ?Enc Vitals Group  ?   BP 109/68  ?   Pulse Rate 87  ?   Resp 17  ?   Temp 98.8 ?F (37.1 ?C)  ?   Temp src   ?   SpO2 98 %  ?   Weight   ?   Height   ?   Head Circumference   ?   Peak Flow   ?   Pain Score 0  ?   Pain Loc   ?   Pain Edu?    ?   Excl. in GC?   ? ?No data found. ? ?Updated Vital Signs ?BP 109/68   Pulse 87   Temp 98.8 ?F (37.1 ?C)   Resp 17   SpO2 98%  ? ?Visual Acuity ?Right Eye Distance:   ?Left Eye Distance:   ?Bilateral Distance:   ? ?Right Eye Near:   ?Left Eye Near:    ?Bilateral Near:    ? ?Physical Exam ?Vitals reviewed.  ?Constitutional:   ?   General: She is not in acute distress. ?   Appearance: She is not toxic-appearing.  ?HENT:  ?   Right Ear: Tympanic membrane and ear canal normal.  ?   Left Ear: Tympanic membrane and ear canal normal.  ?   Nose: Nose normal.  ?   Mouth/Throat:  ?   Mouth: Mucous membranes are moist.  ?   Pharynx: No oropharyngeal exudate or posterior oropharyngeal erythema.  ?Eyes:  ?  Extraocular Movements: Extraocular movements intact.  ?   Conjunctiva/sclera: Conjunctivae normal.  ?   Pupils: Pupils are equal, round, and reactive to light.  ?Cardiovascular:  ?   Rate and Rhythm: Normal rate and regular rhythm.  ?   Heart sounds: No murmur heard. ?Pulmonary:  ?   Effort: Pulmonary effort is normal. No respiratory distress.  ?   Breath sounds: No wheezing, rhonchi or rales.  ?Musculoskeletal:  ?   Cervical back: Neck supple.  ?Lymphadenopathy:  ?   Cervical: No cervical adenopathy.  ?Skin: ?   Capillary Refill: Capillary refill takes less than 2 seconds.  ?   Coloration: Skin is not jaundiced or pale.  ?Neurological:  ?   General: No focal deficit present.  ?   Mental Status: She is alert and oriented to person, place, and time.  ?Psychiatric:     ?   Behavior: Behavior normal.  ? ? ? ?UC Treatments / Results  ?Labs ?(all labs ordered are listed, but only abnormal results are displayed) ?Labs Reviewed  ?SARS CORONAVIRUS 2 (TAT 6-24 HRS)  ? ? ?EKG ? ? ?Radiology ?No results found. ? ?Procedures ?Procedures (including critical care time) ? ?Medications Ordered in UC ?Medications - No data to display ? ?Initial Impression / Assessment and Plan / UC Course  ?I have reviewed the triage vital signs and  the nursing notes. ? ?Pertinent labs & imaging results that were available during my care of the patient were reviewed by me and considered in my medical decision making (see chart for details). ? ?  ? ?Discussed that this is most likely of viral URI.  Will swab for COVID so she knows that she needs to quarantine.  Symptomatic treatment ?Final Clinical Impressions(s) / UC Diagnoses  ? ?Final diagnoses:  ?Viral URI with cough  ? ? ? ?Discharge Instructions   ? ?  ?Benzonatate 100 mg--1 capsule 3 times daily as needed for cough ? ?You can take mucinex D for daytime, as should not make you sleepy. ? ?You have been swabbed for COVID, and the test will result in the next 24 hours. Our staff will call you if positive. If the test is positive, you should quarantine for 5 days.  ? ? ? ? ?ED Prescriptions   ? ? Medication Sig Dispense Auth. Provider  ? benzonatate (TESSALON) 100 MG capsule Take 1 capsule (100 mg total) by mouth every 8 (eight) hours. 21 capsule Zenia Resides, MD  ? ?  ? ?PDMP not reviewed this encounter. ?  ?Zenia Resides, MD ?06/23/21 1714 ? ?  ?Zenia Resides, MD ?06/23/21 1716 ? ?

## 2021-06-24 LAB — SARS CORONAVIRUS 2 (TAT 6-24 HRS): SARS Coronavirus 2: NEGATIVE

## 2021-07-25 ENCOUNTER — Encounter (HOSPITAL_COMMUNITY): Payer: Self-pay | Admitting: Emergency Medicine

## 2021-07-25 ENCOUNTER — Ambulatory Visit (INDEPENDENT_AMBULATORY_CARE_PROVIDER_SITE_OTHER): Payer: BC Managed Care – PPO

## 2021-07-25 ENCOUNTER — Ambulatory Visit (HOSPITAL_COMMUNITY): Payer: Self-pay

## 2021-07-25 ENCOUNTER — Ambulatory Visit (HOSPITAL_COMMUNITY)
Admission: EM | Admit: 2021-07-25 | Discharge: 2021-07-25 | Disposition: A | Discharge status: Home / Self Care | Payer: BC Managed Care – PPO | Attending: Family Medicine | Admitting: Family Medicine

## 2021-07-25 DIAGNOSIS — M79674 Pain in right toe(s): Secondary | ICD-10-CM

## 2021-07-25 NOTE — ED Provider Notes (Signed)
  [facility]   [id number] 07/25/21 Arrival Time: 1151  ASSESSMENT & PLAN:  1. Toe pain, right    I have personally viewed the imaging studies ordered this visit. No toe fracture appreciated.  Declines post-op shoe for comfort. WBAT.  Orders Placed This Encounter  Procedures   DG Toe 5th Right    Recommend:  Follow-up Information     [facility].   Specialty: Urgent Care Why: If worsening or failing to improve as anticipated. Contact information: [address] [place] [address] [phone]               School note provided.  Reviewed expectations re: course of current medical issues. Questions answered. Outlined signs and symptoms indicating need for more acute intervention. Patient verbalized understanding. After Visit Summary given.  SUBJECTIVE: History from: patient. Shelly Peters is a 26 y.o. female who reports RIGHT 5th toe pain s/p falling from scooter yesterday. With skin abrasion; mild initial bleeding; none since. Mild pain. No extremity sensation changes or weakness. Weight bearing normally.  History reviewed. No pertinent surgical history.    OBJECTIVE:  Vitals:   07/25/21 1211  BP: 112/65  Pulse: 64  Resp: 16  Temp: 98.1 F (36.7 C)  TempSrc: Oral  SpO2: 99%  Weight: 90.7 kg  Height: 5\' 6"  (1.676 m)    General appearance: alert; no distress HEENT: [place]; AT Neck: supple with FROM Resp: unlabored respirations Extremities: RLE: warm with well perfused appearance; R 5th toe with superficial superior skin avulsion; nail is present and intact; no bleeding; minimal TTP; FROM and normal cap refill and normal distal sensation R 5th toe Skin: warm and dry; no visible rashes Psychological: alert and cooperative; normal mood and affect  Imaging: DG Toe 5th Right  Result Date: 07/25/2021 CLINICAL DATA:  Right toe pain EXAM: RIGHT FIFTH TOE COMPARISON:  None Available. FINDINGS: There  is no evidence of fracture or dislocation. There is no evidence of arthropathy or other focal bone abnormality. Soft tissues are unremarkable. IMPRESSION: Negative. Electronically Signed   By: 07/27/2021 M.D.   On: 07/25/2021 12:45     Allergies  Allergen Reactions   Apple Juice Swelling   Cherry Swelling    History reviewed. No pertinent past medical history. Social History   Socioeconomic History   Marital status: Single    Spouse name: Not on file   Number of children: Not on file   Years of education: Not on file   Highest education level: Not on file  Occupational History   Not on file  Tobacco Use   Smoking status: Never   Smokeless tobacco: Never  Vaping Use   Vaping Use: Some days  Substance and Sexual Activity   Alcohol use: Yes    Comment: occasionally   Drug use: No   Sexual activity: Yes    Birth control/protection: Implant  Other Topics Concern   Not on file  Social History Narrative   Not on file   Social Determinants of Health   Financial Resource Strain: Not on file  Food Insecurity: Not on file  Transportation Needs: Not on file  Physical Activity: Not on file  Stress: Not on file  Social Connections: Not on file   Family History  Problem Relation Age of Onset   Healthy Mother    Healthy Father    History reviewed. No pertinent surgical history.     07/27/2021, MD 07/25/21 1322

## 2021-07-25 NOTE — Discharge Instructions (Signed)
If not allergic, you may use over the counter ibuprofen or acetaminophen as needed. ° °

## 2021-07-25 NOTE — ED Triage Notes (Signed)
Pt presents with right little toe pain after falling off a scooter last night. States she is able to move the toe but moving it is painful.

## 2021-09-21 ENCOUNTER — Ambulatory Visit (INDEPENDENT_AMBULATORY_CARE_PROVIDER_SITE_OTHER): Payer: Self-pay | Admitting: Primary Care

## 2021-09-21 ENCOUNTER — Encounter (INDEPENDENT_AMBULATORY_CARE_PROVIDER_SITE_OTHER): Payer: Self-pay | Admitting: Primary Care

## 2021-09-21 VITALS — BP 134/76 | HR 85 | Temp 98.2°F | Ht 66.0 in | Wt 218.0 lb

## 2021-09-21 DIAGNOSIS — Z7689 Persons encountering health services in other specified circumstances: Secondary | ICD-10-CM

## 2021-09-21 DIAGNOSIS — Z6835 Body mass index (BMI) 35.0-35.9, adult: Secondary | ICD-10-CM

## 2021-09-21 DIAGNOSIS — Z23 Encounter for immunization: Secondary | ICD-10-CM

## 2021-09-21 NOTE — Progress Notes (Signed)
New Patient Office Visit  Subjective    Patient ID: Shelly Peters, female    DOB: May 15, 1995  Age: 26 y.o. MRN: [id number]  CC:  Chief Complaint  Patient presents with   Establish Care    HPI [name] presents to establish care. She voices no concerns . Patient has No headache, No chest pain, No abdominal pain - No Nausea, No new weakness tingling or numbness, No Cough - shortness of breath    Outpatient Encounter Medications as of 09/21/2021  Medication Sig   benzonatate (TESSALON) 100 MG capsule Take 1 capsule (100 mg total) by mouth every 8 (eight) hours. (Patient not taking: Reported on 09/21/2021)   etonogestrel (NEXPLANON) 68 MG IMPL implant 1 each by Subdermal route once. (Patient not taking: Reported on 09/21/2021)   No facility-administered encounter medications on file as of 09/21/2021.    No past medical history on file.  No past surgical history on file.  Family History  Problem Relation Age of Onset   Healthy Mother    Healthy Father     Social History   Socioeconomic History   Marital status: Single    Spouse name: Not on file   Number of children: Not on file   Years of education: Not on file   Highest education level: Not on file  Occupational History   Not on file  Tobacco Use   Smoking status: Never   Smokeless tobacco: Never  Vaping Use   Vaping Use: Some days  Substance and Sexual Activity   Alcohol use: Yes    Comment: occasionally   Drug use: No   Sexual activity: Yes    Birth control/protection: Implant  Other Topics Concern   Not on file  Social History Narrative   Not on file   Social Determinants of Health   Financial Resource Strain: Not on file  Food Insecurity: Not on file  Transportation Needs: Not on file  Physical Activity: Not on file  Stress: Not on file  Social Connections: Not on file  Intimate Partner Violence: Not on file    ROS Comprehensive ROS Pertinent positive and negative noted in HPI        Objective    BP 134/76   Pulse 85   Temp 98.2 F (36.8 C) (Oral)   Ht 5\' 6"  (1.676 m)   Wt 218 lb (98.9 kg)   SpO2 100%   BMI 35.19 kg/m   Physical Exam Vitals reviewed.  Constitutional:      Appearance: She is obese.  HENT:     Head: Normocephalic.     Right Ear: Tympanic membrane normal.     Left Ear: Tympanic membrane normal.     Nose: Nose normal.  Cardiovascular:     Rate and Rhythm: Normal rate and regular rhythm.  Pulmonary:     Effort: Pulmonary effort is normal.     Breath sounds: Normal breath sounds.  Musculoskeletal:        General: Normal range of motion.     Cervical back: Normal range of motion.  Skin:    General: Skin is warm and dry.  Neurological:     Mental Status: She is alert and oriented to person, place, and time.  Psychiatric:        Mood and Affect: Mood normal.        Behavior: Behavior normal.         Assessment & Plan:  Samoria was seen today for establish care.  Diagnoses and all orders for this visit:  Need for tetanus, diphtheria, and acellular pertussis (Tdap) vaccine -     Tdap vaccine greater than or equal to 7yo IM  Encounter to establish care Establish care with PCP    Grayce Sessions, NP

## 2021-09-27 ENCOUNTER — Other Ambulatory Visit (INDEPENDENT_AMBULATORY_CARE_PROVIDER_SITE_OTHER): Payer: Self-pay | Admitting: Primary Care

## 2021-09-27 ENCOUNTER — Other Ambulatory Visit (INDEPENDENT_AMBULATORY_CARE_PROVIDER_SITE_OTHER): Payer: Self-pay

## 2021-09-27 DIAGNOSIS — Z020 Encounter for examination for admission to educational institution: Secondary | ICD-10-CM

## 2021-09-28 LAB — VARICELLA ZOSTER ABS, IGG/IGM
Varicella IgM: <0.91 index (ref 0.00–0.90)
Varicella zoster IgG: <135 index — ABNORMAL LOW (ref >165)

## 2021-09-28 LAB — MEASLES/MUMPS/RUBELLA IMMUNITY
MUMPS ABS, IGG: 125 AU/mL (ref >10.9)
RUBEOLA AB, IGG: >300 AU/mL (ref >16.4)
Rubella Antibodies, IGG: 3.38 index (ref >0.99)

## 2021-09-28 LAB — HEPATITIS B CORE ANTIBODY, TOTAL: Hep B Core Total Ab: NEGATIVE

## 2021-09-30 ENCOUNTER — Telehealth (INDEPENDENT_AMBULATORY_CARE_PROVIDER_SITE_OTHER): Payer: Self-pay | Admitting: Primary Care

## 2021-09-30 ENCOUNTER — Telehealth (INDEPENDENT_AMBULATORY_CARE_PROVIDER_SITE_OTHER): Payer: Self-pay

## 2021-09-30 NOTE — Telephone Encounter (Signed)
Copied from CRM [id number]. 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  Pt is asking if she can come by and pick it up today.     Please advise. >> Sep 29, 2021  5:14 PM Ja-Kwan M wrote: Pt stated she was returning call to office. Pt requests call back asap

## 2021-09-30 NOTE — Telephone Encounter (Signed)
Left voicemail informing patient that results have been printed and are ready for pickup. Shelly Peters, CMA   Copied from CRM [id number]. 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  Pt is asking if she can come by and pick it up today.     Please advise.

## 2021-10-02 NOTE — Progress Notes (Signed)
Patient ID: NYLAH BUTKUS    08-26-1995  26 y.o. @GENDER @     Subjective:    Patient Care Team    Relationship Specialty Notifications Start End  [id number], NP PCP - General Internal Medicine  09/30/21      History was provided by the Patient   Emonee KEIKO MYRICKS  is a 26 y.o. female  who is here for this wellness visit.   Current Issues: Current concerns include:None Drugs Tobacco: No Alcohol: No Drugs: No  Sex Activity: sexually active  Suicide Risk Emotions: healthy Depression: denies feelings of depression Flowsheet Row Office Visit from 09/21/2021 in [facility]  PHQ-9 Total Score 1       Suicidal: denies suicidal ideation  Allergies  Allergen Reactions   Apple Juice Swelling   Cherry Swelling   No past medical history on file. No past surgical history on file. Family History  Problem Relation Age of Onset   Healthy Mother    Healthy Father    Social History   Socioeconomic History   Marital status: Single    Spouse name: Not on file   Number of children: Not on file   Years of education: Not on file   Highest education level: Not on file  Occupational History   Not on file  Tobacco Use   Smoking status: Never   Smokeless tobacco: Never  Vaping Use   Vaping Use: Some days  Substance and Sexual Activity   Alcohol use: Yes    Comment: occasionally   Drug use: No   Sexual activity: Yes    Birth control/protection: Implant  Other Topics Concern   Not on file  Social History Narrative   Not on file   Social Determinants of Health   Financial Resource Strain: Not on file  Food Insecurity: Not on file  Transportation Needs: Not on file  Physical Activity: Not on file  Stress: Not on file  Social Connections: Not on file  Intimate Partner Violence: Not on file   Allergies as of 09/27/2021       Reactions   Apple Juice Swelling   Cherry Swelling        Medication List        Accurate as of  September 27, 2021 11:59 PM. If you have any questions, ask your nurse or doctor.          benzonatate 100 MG capsule Commonly known as: TESSALON Take 1 capsule (100 mg total) by mouth every 8 (eight) hours.   Nexplanon 68 MG Impl implant Generic drug: etonogestrel 1 each by Subdermal route once.          Objective:   There were no vitals taken for this visit.   General:   alert, cooperative, appears stated age, and mildly obese  Gait:   normal  Skin:   normal  Oral cavity:   lips, mucosa, and tongue normal; teeth and gums normal  Eyes:   sclerae white, pupils equal and reactive, red reflex normal bilaterally  Ears:   normal bilaterally  Neck:   normal  Lungs:  clear to auscultation bilaterally  Heart:   regular rate and rhythm, S1, S2 normal, no murmur, click, rub or gallop  Abdomen:  soft, non-tender; bowel sounds normal; no masses,  no organomegaly  GU:  not examined  Extremities:   extremities normal, atraumatic, no cyanosis or edema  Neuro:  normal without focal findings, mental status, speech normal, alert  and oriented x3, PERLA, and reflexes normal and symmetric    No results found.  Assessment/Plan:  BERDA SHELVIN is a healthy 26 y.o. female present for well child visit.  Immunizations: not UTD  guidance discussed. Follow-up visit in 12 months for next wellness visit, or sooner as needed.  Exam completed and several vaccines/ immunization were greater then 10 years and titers will be drawn.   This note has been created with Education officer, environmental. Any transcriptional errors are unintentional.   Grayce Sessions, NP 10/02/2021, 9:11 PM

## 2021-10-18 ENCOUNTER — Ambulatory Visit (INDEPENDENT_AMBULATORY_CARE_PROVIDER_SITE_OTHER): Payer: Medicaid Other

## 2021-10-25 ENCOUNTER — Ambulatory Visit (INDEPENDENT_AMBULATORY_CARE_PROVIDER_SITE_OTHER): Payer: Medicaid Other

## 2021-10-25 ENCOUNTER — Telehealth (INDEPENDENT_AMBULATORY_CARE_PROVIDER_SITE_OTHER): Payer: Self-pay | Admitting: Primary Care

## 2021-10-25 NOTE — Telephone Encounter (Signed)
Attempted to reach patient to inform her that she would need to reschedule her appointment because the office will not be open at that time.

## 2021-10-26 ENCOUNTER — Ambulatory Visit (INDEPENDENT_AMBULATORY_CARE_PROVIDER_SITE_OTHER): Payer: Self-pay

## 2021-10-26 DIAGNOSIS — Z111 Encounter for screening for respiratory tuberculosis: Secondary | ICD-10-CM

## 2021-10-28 ENCOUNTER — Encounter (HOSPITAL_COMMUNITY): Payer: Self-pay

## 2021-10-28 ENCOUNTER — Ambulatory Visit (HOSPITAL_COMMUNITY)
Admission: RE | Admit: 2021-10-28 | Discharge: 2021-10-28 | Disposition: A | Discharge status: Home / Self Care | Payer: No Typology Code available for payment source | Source: Ambulatory Visit | Attending: Nurse Practitioner | Admitting: Nurse Practitioner

## 2021-10-28 ENCOUNTER — Ambulatory Visit (INDEPENDENT_AMBULATORY_CARE_PROVIDER_SITE_OTHER): Payer: Medicaid Other

## 2021-10-28 VITALS — BP 110/73 | HR 83 | Temp 98.7°F | Resp 18

## 2021-10-28 DIAGNOSIS — R051 Acute cough: Secondary | ICD-10-CM

## 2021-10-28 DIAGNOSIS — Z20822 Contact with and (suspected) exposure to covid-19: Secondary | ICD-10-CM | POA: Diagnosis not present

## 2021-10-28 DIAGNOSIS — R6883 Chills (without fever): Secondary | ICD-10-CM | POA: Diagnosis present

## 2021-10-28 DIAGNOSIS — J069 Acute upper respiratory infection, unspecified: Secondary | ICD-10-CM | POA: Insufficient documentation

## 2021-10-28 DIAGNOSIS — Z111 Encounter for screening for respiratory tuberculosis: Secondary | ICD-10-CM

## 2021-10-28 LAB — POCT RAPID STREP A, ED / UC: Streptococcus, Group A Screen (Direct): NEGATIVE

## 2021-10-28 LAB — TB SKIN TEST
Induration: 0 mm
TB Skin Test: NEGATIVE

## 2021-10-28 LAB — RESP PANEL BY RT-PCR (FLU A&B, COVID) ARPGX2
Influenza A by PCR: NEGATIVE
Influenza B by PCR: NEGATIVE
SARS Coronavirus 2 by RT PCR: NEGATIVE

## 2021-10-28 NOTE — Discharge Instructions (Addendum)
Your COVID, Influenza ar pending  Strep Test is negative You may have an Upper Respiratory Infection We encourage conservative treatment with symptom relief. We encourage you to use Tylenol alternating with Ibuprofen for your fever if not contraindicated. (Remember to use as directed do not exceed daily dosing recommendations) We also encourage salt water gargles for your sore throat. You should also consider throat lozenges and chloraseptic spray.  Your cough can be soothed with a cough suppressant. We have prescribed you a cough suppressant to be taken as  directed.

## 2021-10-28 NOTE — ED Provider Notes (Signed)
[facility]    CSN: [id number] Arrival date & time: 10/28/21  1113      History   Chief Complaint Chief Complaint  Patient presents with  . Cough  . Nasal Congestion    HPI Shelly Peters is a 26 y.o. female.   HPI  She is complaining of one day of chills, cough and nasal congestion, runny nose, sore throat, new loss of smell or taste, . She denies any recent exposures. She is in nursing school. She has not completed any treatment. Denies fever, shortness of breath, nausea, diarrhea. She has seasonal allergies that she treats in the spring with Zyrtec.  History reviewed. No pertinent past medical history.  There are no problems to display for this patient.   History reviewed. No pertinent surgical history.  OB History   No obstetric history on file.      Home Medications    Prior to Admission medications   Medication Sig Start Date End Date Taking? Authorizing Provider  benzonatate (TESSALON) 100 MG capsule Take 1 capsule (100 mg total) by mouth every 8 (eight) hours. Patient not taking: Reported on 09/21/2021 06/23/21   Zenia Resides, MD  etonogestrel (NEXPLANON) 68 MG IMPL implant 1 each by Subdermal route once. Patient not taking: Reported on 09/21/2021    [provider]    Family History Family History  Problem Relation Age of Onset  . Healthy Mother   . Healthy Father     Social History Social History   Tobacco Use  . Smoking status: Never  . Smokeless tobacco: Never  Vaping Use  . Vaping Use: Some days  Substance Use Topics  . Alcohol use: Yes    Comment: occasionally  . Drug use: No     Allergies   Apple juice and Cherry   Review of Systems Review of Systems   Physical Exam Triage Vital Signs ED Triage Vitals [10/28/21 1159]  Enc Vitals Group     BP 110/73     Pulse Rate 83     Resp 18     Temp 98.7 F (37.1 C)     Temp Source Oral     SpO2 97 %     Weight      Height      Head Circumference       Peak Flow      Pain Score 7     Pain Loc      Pain Edu?      Excl. in [place]?    No data found.  Updated Vital Signs BP 110/73 (BP Location: Right Arm)   Pulse 83   Temp 98.7 F (37.1 C) (Oral)   Resp 18   LMP 10/27/2021   SpO2 97%   Visual Acuity Right Eye Distance:   Left Eye Distance:   Bilateral Distance:    Right Eye Near:   Left Eye Near:    Bilateral Near:     Physical Exam Constitutional:      General: She is not in acute distress.    Appearance: She is obese.  HENT:     Head: Normocephalic and atraumatic.     Right Ear: Tympanic membrane normal.     Left Ear: Tympanic membrane normal.     Nose: Nose normal.     Mouth/Throat:     Mouth: Mucous membranes are moist.  Eyes:     Pupils: Pupils are equal, round, and reactive to light.  Cardiovascular:  Rate and Rhythm: Normal rate and regular rhythm.     Pulses: Normal pulses.  Pulmonary:     Effort: Pulmonary effort is normal.     Breath sounds: Normal breath sounds.  Musculoskeletal:        General: Normal range of motion.     Cervical back: Normal range of motion.  Skin:    General: Skin is warm.     Capillary Refill: Capillary refill takes less than 2 seconds.  Neurological:     General: No focal deficit present.     Mental Status: She is alert and oriented to person, place, and time.  Psychiatric:        Mood and Affect: Mood normal.        Behavior: Behavior normal.     UC Treatments / Results  Labs (all labs ordered are listed, but only abnormal results are displayed) Labs Reviewed  SARS CORONAVIRUS 2 BY RT PCR  POCT RAPID STREP A, ED / UC  POC INFLUENZA A AND B ANTIGEN (URGENT CARE ONLY)    EKG   Radiology No results found.  Procedures Procedures (including critical care time)  Medications Ordered in UC Medications - No data to display  Initial Impression / Assessment and Plan / UC Course  I have reviewed the triage vital signs and the nursing notes.  Pertinent labs &  imaging results that were available during my care of the patient were reviewed by me and considered in my medical decision making (see chart for details).     Cough  Chills Final Clinical Impressions(s) / UC Diagnoses   Final diagnoses:  Acute cough  Chills     Discharge Instructions      Your COVID, Influenza ar pending  Strep Test is negative You may have an Upper Respiratory Infection We encourage conservative treatment with symptom relief. We encourage you to use Tylenol alternating with Ibuprofen for your fever if not contraindicated. (Remember to use as directed do not exceed daily dosing recommendations) We also encourage salt water gargles for your sore throat. You should also consider throat lozenges and chloraseptic spray.  Your cough can be soothed with a cough suppressant. We have prescribed you a cough suppressant to be taken as  directed.       ED Prescriptions   None    PDMP not reviewed this encounter.

## 2021-10-28 NOTE — ED Triage Notes (Signed)
Cough, sore throat, congestion, can't taste or smell anything unless blows nose really hard but just for a few seconds. All symptoms started yesterday. Used throat spray and cough drops.

## 2021-11-21 ENCOUNTER — Ambulatory Visit (HOSPITAL_COMMUNITY)
Admission: RE | Admit: 2021-11-21 | Discharge: 2021-11-21 | Disposition: A | Discharge status: Home / Self Care | Payer: No Typology Code available for payment source | Source: Ambulatory Visit | Attending: Emergency Medicine | Admitting: Emergency Medicine

## 2021-11-21 ENCOUNTER — Encounter (HOSPITAL_COMMUNITY): Payer: Self-pay

## 2021-11-21 VITALS — BP 111/70 | HR 69 | Temp 98.8°F | Resp 16

## 2021-11-21 DIAGNOSIS — H66002 Acute suppurative otitis media without spontaneous rupture of ear drum, left ear: Secondary | ICD-10-CM | POA: Diagnosis not present

## 2021-11-21 MED ORDER — AMOXICILLIN 500 MG PO CAPS: 500.0000 mg | ORAL_CAPSULE | Freq: Two times a day (BID) | ORAL | 0 refills | Status: AC

## 2021-11-21 NOTE — ED Provider Notes (Signed)
Shelly Peters    CSN: [id number] Arrival date & time: 11/21/21  1553     History   Chief Complaint Chief Complaint  Patient presents with   Ear Fullness    I think I have an ear infection - Entered by patient    HPI Shelly Peters is a 26 y.o. female.  Presents with 1 week history of right ear pain. Reports throbbing, 8/10 pain Has tried OTC ear wax removal and q-tips  Sick with URI symptoms about 3 weeks ago  No fever, drainage, hearing loss   History reviewed. No pertinent past medical history.  There are no problems to display for this patient.   History reviewed. No pertinent surgical history.  OB History   No obstetric history on file.      Home Medications    Prior to Admission medications   Medication Sig Start Date End Date Taking? Authorizing Provider  amoxicillin (AMOXIL) 500 MG capsule Take 1 capsule (500 mg total) by mouth 2 (two) times daily for 5 days. 11/21/21 11/26/21 Yes Derra Shartzer, Wells Guiles, PA-C  etonogestrel (NEXPLANON) 68 MG IMPL implant 1 each by Subdermal route once.   Yes [provider]  benzonatate (TESSALON) 100 MG capsule Take 1 capsule (100 mg total) by mouth every 8 (eight) hours. Patient not taking: Reported on 09/21/2021 06/23/21   Barrett Henle, MD    Family History Family History  Problem Relation Age of Onset   Healthy Mother    Healthy Father     Social History Social History   Tobacco Use   Smoking status: Never   Smokeless tobacco: Never  Vaping Use   Vaping Use: Some days  Substance Use Topics   Alcohol use: Yes    Comment: occasionally   Drug use: No     Allergies   Apple juice and Cherry   Review of Systems Review of Systems  Per HPI  Physical Exam Triage Vital Signs ED Triage Vitals  Enc Vitals Group     BP 11/21/21 1616 111/70     Pulse Rate 11/21/21 1616 69     Resp 11/21/21 1616 16     Temp 11/21/21 1616 98.8 F (37.1 C)     Temp Source 11/21/21 1616 Oral     SpO2  11/21/21 1616 98 %     Weight --      Height --      Head Circumference --      Peak Flow --      Pain Score 11/21/21 1618 8     Pain Loc --      Pain Edu? --      Excl. in [place]? --    No data found.  Updated Vital Signs BP 111/70 (BP Location: Right Arm)   Pulse 69   Temp 98.8 F (37.1 C) (Oral)   Resp 16   LMP 11/16/2021 (Exact Date)   SpO2 98%   Physical Exam Vitals and nursing note reviewed.  Constitutional:      General: She is not in acute distress. HENT:     Right Ear: External ear normal. No middle ear effusion. No mastoid tenderness. Tympanic membrane is not retracted or bulging.     Left Ear: External ear normal. A middle ear effusion is present. No mastoid tenderness. Tympanic membrane is bulging.     Ears:     Comments: Purulent fluid behind left TM. Right TM dull    Nose: Nose normal.  Mouth/Throat:     Mouth: Mucous membranes are moist.     Pharynx: Oropharynx is clear.  Eyes:     Extraocular Movements: Extraocular movements intact.     Conjunctiva/sclera: Conjunctivae normal.     Pupils: Pupils are equal, round, and reactive to light.  Cardiovascular:     Rate and Rhythm: Normal rate and regular rhythm.     Heart sounds: Normal heart sounds.  Pulmonary:     Effort: Pulmonary effort is normal.     Breath sounds: Normal breath sounds.  Abdominal:     General: Abdomen is flat.     Palpations: Abdomen is soft.     Tenderness: There is no abdominal tenderness.  Lymphadenopathy:     Cervical: No cervical adenopathy.  Neurological:     Mental Status: She is alert and oriented to person, place, and time.      UC Treatments / Results  Labs (all labs ordered are listed, but only abnormal results are displayed) Labs Reviewed - No data to display  EKG  Radiology No results found.  Procedures Procedures   Medications Ordered in UC Medications - No data to display  Initial Impression / Assessment and Plan / UC Course  I have reviewed the  triage vital signs and the nursing notes.  Pertinent labs & imaging results that were available during my care of the patient were reviewed by me and considered in my medical decision making (see chart for details).  Patient presented for right ear pain but surprisingly left ear appearing infected. She was sick with URI just before onset of ear symptoms Amoxicillin twice daily for 5 days.  Discussed using allergy medicine and/or nasal spray to help with fluid buildup behind the ears.  She can return to urgent care if symptoms persist despite antibiotic Note provided Return precautions discussed. Patient agrees to plan  Final Clinical Impressions(s) / UC Diagnoses   Final diagnoses:  Acute suppurative otitis media of left ear without spontaneous rupture of tympanic membrane, recurrence not specified     Discharge Instructions      Please take medication as prescribed. Take with food to avoid upset stomach.  Try a daily nasal spray like Flonase, and daily allergy medicine such as zyrtec, allegra, etc  You can return to the urgent care if symptoms persist despite medication.    ED Prescriptions     Medication Sig Dispense Auth. Provider   amoxicillin (AMOXIL) 500 MG capsule Take 1 capsule (500 mg total) by mouth 2 (two) times daily for 5 days. 10 capsule Graycie Halley, Wells Guiles, PA-C      PDMP not reviewed this encounter.   Omir Cooprider, Vernice Jefferson 11/21/21 1635

## 2021-11-21 NOTE — ED Triage Notes (Signed)
Patient having discomfort in the right ear. Throbbing pain and fullness. Onset for a week.   Patient has been using q-tips in the ear and otc wax cleaner ear drops, not helping. No history of ear issues.

## 2021-11-21 NOTE — Discharge Instructions (Signed)
Please take medication as prescribed. Take with food to avoid upset stomach.  Try a daily nasal spray like Flonase, and daily allergy medicine such as zyrtec, allegra, etc  You can return to the urgent care if symptoms persist despite medication.

## 2021-11-25 ENCOUNTER — Ambulatory Visit (HOSPITAL_COMMUNITY)
Admission: EM | Admit: 2021-11-25 | Discharge: 2021-11-25 | Disposition: A | Discharge status: Home / Self Care | Payer: No Typology Code available for payment source | Attending: Emergency Medicine | Admitting: Emergency Medicine

## 2021-11-25 ENCOUNTER — Encounter (HOSPITAL_COMMUNITY): Payer: Self-pay

## 2021-11-25 DIAGNOSIS — R051 Acute cough: Secondary | ICD-10-CM

## 2021-11-25 DIAGNOSIS — R0981 Nasal congestion: Secondary | ICD-10-CM | POA: Diagnosis not present

## 2021-11-25 DIAGNOSIS — H9201 Otalgia, right ear: Secondary | ICD-10-CM

## 2021-11-25 MED ORDER — PREDNISONE 10 MG (21) PO TBPK: ORAL_TABLET | Freq: Every day | ORAL | 0 refills | Status: AC

## 2021-11-25 MED ORDER — IBUPROFEN 800 MG PO TABS: 800.0000 mg | ORAL_TABLET | Freq: Three times a day (TID) | ORAL | 0 refills | Status: AC

## 2021-11-25 NOTE — Discharge Instructions (Signed)
Take Tylenol or ibuprofen as needed for pain Both your ears look clear no signs of infection Complete the dose of antibiotics with food Stay hydrated drink plenty of fluids If symptoms persist you need to follow-up with ENT

## 2021-11-25 NOTE — ED Provider Notes (Signed)
[place]    CSN: [id number] Arrival date & time: 11/25/21  1106      History   Chief Complaint Chief Complaint  Patient presents with   Otalgia    HPI Shelly Peters is a 26 y.o. female.   Patient returns today after being seen on 9-18 for otitis media.  Patient states that she still feels like her right ear has pain and pressure.  Still has nasal congestion with slight cough.  Patient is taking antibiotics as prescribed with no change.  Has taken over-the-counter Claritin with no change.  Denies any noted fevers at this time.    History reviewed. No pertinent past medical history.  There are no problems to display for this patient.   History reviewed. No pertinent surgical history.  OB History   No obstetric history on file.      Home Medications    Prior to Admission medications   Medication Sig Start Date End Date Taking? Authorizing Provider  amoxicillin (AMOXIL) 500 MG capsule Take 1 capsule (500 mg total) by mouth 2 (two) times daily for 5 days. 11/21/21 11/26/21  Rising, Wells Guiles, PA-C  benzonatate (TESSALON) 100 MG capsule Take 1 capsule (100 mg total) by mouth every 8 (eight) hours. Patient not taking: Reported on 09/21/2021 06/23/21   Barrett Henle, MD  etonogestrel (NEXPLANON) 68 MG IMPL implant 1 each by Subdermal route once.    [provider]    Family History Family History  Problem Relation Age of Onset   Healthy Mother    Healthy Father     Social History Social History   Tobacco Use   Smoking status: Never   Smokeless tobacco: Never  Vaping Use   Vaping Use: Some days  Substance Use Topics   Alcohol use: Yes    Comment: occasionally   Drug use: No     Allergies   Apple juice and Cherry   Review of Systems Review of Systems  Constitutional:  Negative for fever.  HENT:  Positive for congestion, ear pain, postnasal drip, rhinorrhea, sinus pressure, sinus pain and sneezing. Negative for ear discharge and  sore throat.   Eyes: Negative.   Respiratory:  Positive for cough. Negative for shortness of breath.   Cardiovascular: Negative.   Gastrointestinal: Negative.   Genitourinary: Negative.   Neurological: Negative.      Physical Exam Triage Vital Signs ED Triage Vitals  Enc Vitals Group     BP 11/25/21 1236 118/76     Pulse Rate 11/25/21 1236 76     Resp 11/25/21 1236 12     Temp 11/25/21 1236 98.2 F (36.8 C)     Temp Source 11/25/21 1236 Oral     SpO2 11/25/21 1236 98 %     Weight 11/25/21 1236 205 lb (93 kg)     Height 11/25/21 1236 5\' 6"  (1.676 m)     Head Circumference --      Peak Flow --      Pain Score 11/25/21 1235 8     Pain Loc --      Pain Edu? --      Excl. in [place]? --    No data found.  Updated Vital Signs BP 118/76 (BP Location: Left Arm)   Pulse 76   Temp 98.2 F (36.8 C) (Oral)   Resp 12   Ht 5\' 6"  (1.676 m)   Wt 205 lb (93 kg)   LMP 11/16/2021 (Exact Date)   SpO2 98%  BMI 33.09 kg/m   Visual Acuity Right Eye Distance:   Left Eye Distance:   Bilateral Distance:    Right Eye Near:   Left Eye Near:    Bilateral Near:     Physical Exam Constitutional:      Appearance: Normal appearance.  HENT:     Right Ear: Tympanic membrane normal.     Left Ear: Tympanic membrane normal.     Nose: Congestion present.     Mouth/Throat:     Mouth: Mucous membranes are moist.     Pharynx: Posterior oropharyngeal erythema present.  Eyes:     Pupils: Pupils are equal, round, and reactive to light.  Cardiovascular:     Rate and Rhythm: Normal rate.  Pulmonary:     Effort: Pulmonary effort is normal.  Abdominal:     General: Abdomen is flat.  Musculoskeletal:        General: Normal range of motion.     Cervical back: Normal range of motion.  Skin:    General: Skin is warm.     Capillary Refill: Capillary refill takes less than 2 seconds.  Neurological:     Mental Status: She is alert.      UC Treatments / Results  Labs (all labs ordered are  listed, but only abnormal results are displayed) Labs Reviewed - No data to display  EKG   Radiology No results found.  Procedures Procedures (including critical care time)  Medications Ordered in UC Medications - No data to display  Initial Impression / Assessment and Plan / UC Course  I have reviewed the triage vital signs and the nursing notes.  Pertinent labs & imaging results that were available during my care of the patient were reviewed by me and considered in my medical decision making (see chart for details).     Take Tylenol or ibuprofen as needed for pain Both your ears look clear no signs of infection Complete the dose of antibiotics with food Stay hydrated drink plenty of fluids If symptoms persist you need to follow-up with ENT Final Clinical Impressions(s) / UC Diagnoses   Final diagnoses:  None   Discharge Instructions   None    ED Prescriptions   None    PDMP not reviewed this encounter.   Marney Setting, NP 11/25/21 1259

## 2021-11-25 NOTE — ED Triage Notes (Signed)
Pt is here for follow up ear pain in the right ear. Pt states meds are not working

## 2022-03-02 ENCOUNTER — Emergency Department (HOSPITAL_COMMUNITY)
Admission: EM | Admit: 2022-03-02 | Discharge: 2022-03-02 | Disposition: A | Discharge status: Home / Self Care | Payer: No Typology Code available for payment source | Attending: Emergency Medicine | Admitting: Emergency Medicine

## 2022-03-02 ENCOUNTER — Other Ambulatory Visit: Payer: Self-pay

## 2022-03-02 ENCOUNTER — Encounter (HOSPITAL_COMMUNITY): Payer: Self-pay

## 2022-03-02 DIAGNOSIS — R509 Fever, unspecified: Secondary | ICD-10-CM | POA: Diagnosis present

## 2022-03-02 DIAGNOSIS — U071 COVID-19: Secondary | ICD-10-CM | POA: Insufficient documentation

## 2022-03-02 LAB — RESP PANEL BY RT-PCR (RSV, FLU A&B, COVID)  RVPGX2
Influenza A by PCR: NEGATIVE
Influenza B by PCR: NEGATIVE
Resp Syncytial Virus by PCR: NEGATIVE
SARS Coronavirus 2 by RT PCR: POSITIVE — AB

## 2022-03-02 MED ORDER — FLUTICASONE PROPIONATE 50 MCG/ACT NA SUSP: 2.0000 | Freq: Every day | NASAL | 0 refills | Status: AC

## 2022-03-02 NOTE — ED Provider Notes (Signed)
Shelly Peters   CSN: [id number] Arrival date & time: 03/02/22  1654     History Chief Complaint  Patient presents with   Fever   Nasal Congestion   Generalized Body Aches    Shelly Peters is a 26 y.o. female patient who presents to the emergency department today for evaluation of nasal congestion, fever, general malaise.  Symptoms started 2 days ago.  She denies any cough, congestion, shortness of breath, nausea, vomiting, diarrhea.   Fever      Home Medications Prior to Admission medications   Medication Sig Start Date End Date Taking? Authorizing Provider  fluticasone (FLONASE) 50 MCG/ACT nasal spray Place 2 sprays into both nostrils daily. 03/02/22  Yes Shelly Peters, Shelly Peters, Shelly Peters  benzonatate (TESSALON) 100 MG capsule Take 1 capsule (100 mg total) by mouth every 8 (eight) hours. Patient not taking: Reported on 09/21/2021 06/23/21   Shelly Peters, Shelly Peters  etonogestrel (NEXPLANON) 68 MG IMPL implant 1 each by Subdermal route once.    Provider, Historical, Shelly Peters  ibuprofen (ADVIL) 800 MG tablet Take 1 tablet (800 mg total) by mouth 3 (three) times daily. 11/25/21   Shelly Peters, Shelly Peters  predniSONE (STERAPRED UNI-PAK 21 TAB) 10 MG (21) TBPK tablet Take by mouth daily. Take 6 tabs by mouth daily  for 2 days, then 5 tabs for 2 days, then 4 tabs for 2 days, then 3 tabs for 2 days, 2 tabs for 2 days, then 1 tab by mouth daily for 2 days 11/25/21   Shelly Peters, Shelly Peters      Allergies    Apple juice and Cherry    Review of Systems   Review of Systems  Constitutional:  Positive for fever.  All other systems reviewed and are negative.   Physical Exam Updated Vital Signs BP 122/70   Pulse (!) 105   Temp 99.2 F (37.3 C) (Oral)   Resp 16   Ht 5\' 6"  [name]   Wt 86 kg   SpO2 99%   BMI 30.60 kg/Peters  Physical Exam Vitals and nursing Peters reviewed.  Constitutional:      General: She is not in acute distress.    Appearance:  Normal appearance.  HENT:     Head: Normocephalic and atraumatic.  Eyes:     General:        Right eye: No discharge.        Left eye: No discharge.  Cardiovascular:     Comments: Regular rate and rhythm.  S1/S2 are distinct without any evidence of murmur, rubs, or gallops.  Radial pulses are 2+ bilaterally.  Dorsalis pedis pulses are 2+ bilaterally.  No evidence of pedal edema. Pulmonary:     Comments: Clear to auscultation bilaterally.  Normal effort.  No respiratory distress.  No evidence of wheezes, rales, or rhonchi heard throughout. Abdominal:     General: Abdomen is flat. Bowel sounds are normal. There is no distension.     Tenderness: There is no abdominal tenderness. There is no guarding or rebound.  Musculoskeletal:        General: Normal range of motion.     Cervical back: Neck supple.  Skin:    General: Skin is warm and dry.     Findings: No rash.  Neurological:     General: No focal deficit present.     Mental Status: She is alert.  Psychiatric:        Mood and Affect: Mood normal.  Behavior: Behavior normal.     ED Results / Procedures / Treatments   Labs (all labs ordered are listed, but only abnormal results are displayed) Labs Reviewed  RESP PANEL BY RT-PCR (RSV, FLU A&B, COVID)  [id number] - Abnormal; Notable for the following components:      Result Value   SARS Coronavirus 2 by RT PCR POSITIVE (*)    All other components within normal limits    EKG None  Radiology No results found.  Procedures Procedures    Medications Ordered in ED Medications - No data to display  ED Course/ Medical Decision Making/ A&P Clinical Course as of 03/02/22 2221  Thu Mar 02, 2022  2219 Resp panel by RT-PCR (RSV, Flu A&B, Covid) Anterior Nasal Swab(!) Positive for COVID. [CF]    Clinical Course User Index [CF] Shelly Peters, Shelly Peters                           Medical Decision Making Shelly Peters is a 26 y.o. female patient who presents to the emergency  department today for further evaluation of nasal congestion, fever, general malaise.  Patient is slightly tachycardic and does have a low-grade temperature.  She did take some Tylenol prior to arrival which is why she is likely afebrile.  Lungs are clear and I have a low suspicion at this time for pneumonia.  I will plan to swab her with a respiratory panel.  Patient tested positive for COVID which is likely the source of her symptoms.  I will have her quarantine for 5 days since symptom onset.  As long as she is fever free 24 hours after her 5-day quarantine she may return to work.  I provided a work Peters for her.  I educated the patient on alternating ibuprofen and Tylenol.  I also educated the patient on adequate rest and fluid intake.  Strict precautions were discussed.  She is safer discharge at this time.  Amount and/or Complexity of Data Reviewed Labs:  Decision-making details documented in ED Course.    Final Clinical Impression(s) / ED Diagnoses Final diagnoses:  COVID    Rx / DC Orders ED Discharge Orders          Ordered    fluticasone (FLONASE) 50 MCG/ACT nasal spray  Daily        03/02/22 2221              Shelly Peters, Shelly Peters 03/02/22 2221    Ezequiel Essex, Shelly Peters 03/02/22 2326

## 2022-03-02 NOTE — ED Triage Notes (Signed)
Patient reports that she began having nasal congestion, fever, and body aches on 02/28/22. Patient states her temp was 101.0 Patient reports taking TheraFlu prior to coming to the Ed.

## 2022-03-02 NOTE — Discharge Instructions (Signed)
You tested positive for COVID today.  We will treat this very similar to influenza.  All of the things we discussed today including alternating ibuprofen and Tylenol, nasal sprays which I have given you a prescription for, and rest are applied.  Please drink plenty of fluids.  I will give you a work note for tomorrow and the rest of the weekend.  Please follow-up with your primary care doctor.  You must quarantine the 5 days from symptom onset.  And you may return to work once you are fever free for 24 hours after quarantine.  Please return to the emergency department for any worsening symptoms

## 2022-11-24 IMAGING — DX DG TOE 5TH 2+V*R*
3 series · 3 of 3 positions shown · non-contrast
Comparison: None Available.

CLINICAL DATA: Right toe pain

EXAM:
RIGHT FIFTH TOE

[toe ap]
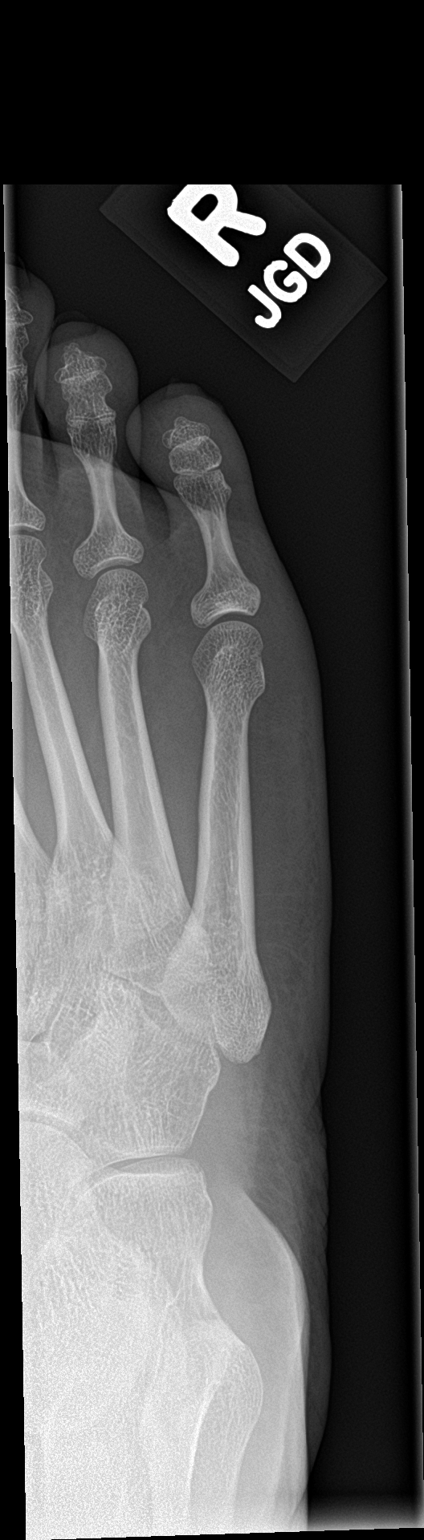

[toe obl]
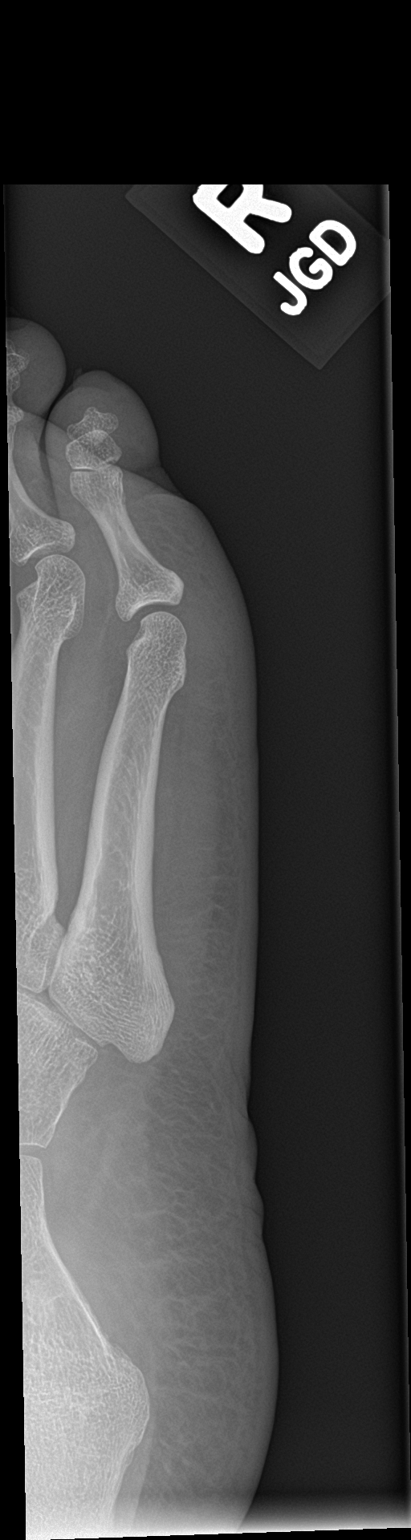

[toe lat]
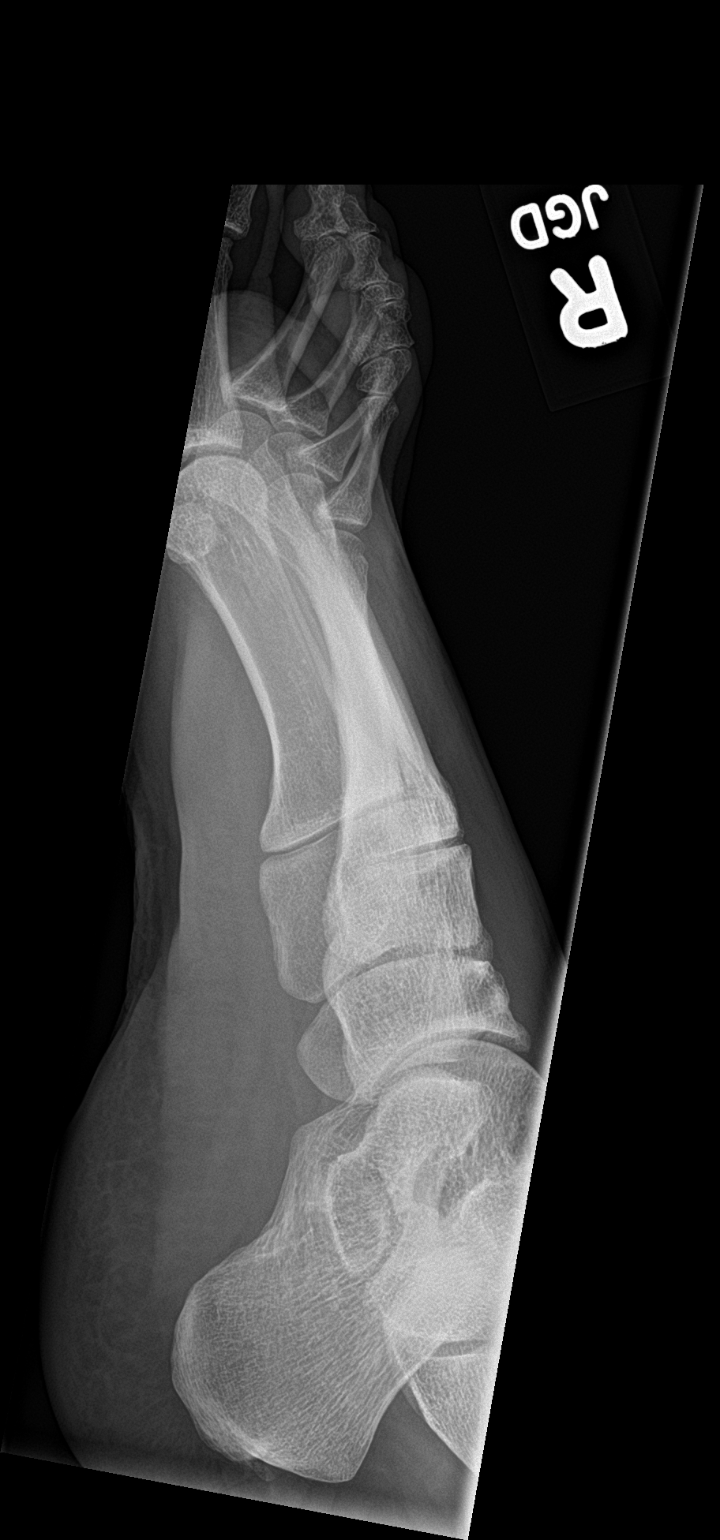

[3 of 3 positions shown; findings below may reference images not displayed]

FINDINGS: There is no evidence of fracture or dislocation. There is no
evidence of arthropathy or other focal bone abnormality. Soft
tissues are unremarkable.
IMPRESSION: Negative.
